# Patient Record
Sex: Male | Born: 1990 | Race: White | Hispanic: No | Marital: Single | Smoking: Current every day smoker
Health system: Southern US, Community
[De-identification: ages and names within clinical notes are randomized; demographics above are authoritative.]

## PROBLEM LIST (undated history)

## (undated) DIAGNOSIS — F1990 Other psychoactive substance use, unspecified, uncomplicated: Secondary | ICD-10-CM

## (undated) HISTORY — PX: EYE SURGERY: SHX253

---
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
## 2011-11-04 ENCOUNTER — Emergency Department: Payer: Self-pay | Admitting: Emergency Medicine

## 2011-11-07 LAB — BETA STREP CULTURE(ARMC)

## 2013-05-26 ENCOUNTER — Emergency Department: Payer: Self-pay | Admitting: Emergency Medicine

## 2013-07-18 ENCOUNTER — Emergency Department: Payer: Self-pay | Admitting: Emergency Medicine

## 2014-02-20 ENCOUNTER — Emergency Department: Payer: Self-pay | Admitting: Emergency Medicine

## 2015-02-01 ENCOUNTER — Encounter: Payer: Self-pay | Admitting: Emergency Medicine

## 2015-02-01 ENCOUNTER — Emergency Department
Admission: EM | Admit: 2015-02-01 | Discharge: 2015-02-01 | Disposition: A | Discharge status: Home / Self Care | Payer: Medicare (Managed Care) | Attending: Emergency Medicine | Admitting: Emergency Medicine

## 2015-02-01 ENCOUNTER — Emergency Department: Payer: Medicare (Managed Care)

## 2015-02-01 DIAGNOSIS — Z880 Allergy status to penicillin: Secondary | ICD-10-CM | POA: Diagnosis not present

## 2015-02-01 DIAGNOSIS — M25532 Pain in left wrist: Secondary | ICD-10-CM | POA: Diagnosis not present

## 2015-02-01 MED ORDER — NAPROXEN 500 MG PO TABS: 500.0000 mg | ORAL_TABLET | Freq: Two times a day (BID) | ORAL | Status: DC
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
## 2015-02-01 NOTE — Discharge Instructions (Signed)
Wear splint until evaluation by orthopedic Dr. °
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
## 2017-07-13 ENCOUNTER — Other Ambulatory Visit: Payer: Self-pay

## 2017-07-13 ENCOUNTER — Emergency Department
Admission: EM | Admit: 2017-07-13 | Discharge: 2017-07-13 | Disposition: A | Discharge status: Home / Self Care | Payer: PRIVATE HEALTH INSURANCE | Attending: Emergency Medicine | Admitting: Emergency Medicine

## 2017-07-13 ENCOUNTER — Encounter: Payer: Self-pay | Admitting: Emergency Medicine

## 2017-07-13 DIAGNOSIS — G5611 Other lesions of median nerve, right upper limb: Secondary | ICD-10-CM | POA: Insufficient documentation

## 2017-07-13 DIAGNOSIS — F17210 Nicotine dependence, cigarettes, uncomplicated: Secondary | ICD-10-CM | POA: Insufficient documentation

## 2017-07-13 NOTE — ED Notes (Signed)
FIRST NURSE NOTE:   Pt c/o decreased sensation in right hand for several days, states he felt a shooting pain from elbow down to hand last night. Pt states it is difficulty to grip things also.

## 2017-07-13 NOTE — ED Notes (Signed)
No protocols to be started at this time per Dr. Lenard Lance.

## 2017-07-13 NOTE — ED Triage Notes (Signed)
Pt comes into the ED via POV c/o hand numbness that has been ongoing for a couple of days.  Patient states that he woke up last night and the numbness was up to his elbow.  Patient denies any weakness or numbness anywhere else.  Patient states it is making it hard to grip things.  Patient having intermittent pain with it.  Patient in NAD at this time.
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
## 2017-07-13 NOTE — Discharge Instructions (Addendum)
Of increasing weakness, increasing numbness, or numbness or weakness or pain in any other areas of your body, return to the emergency room otherwise follow closely with orthopedic surgery.  Take Motrin for the discomfort.  Use the splint.  Try to decrease activity in that hand to the extent possible.

## 2018-12-07 ENCOUNTER — Emergency Department (HOSPITAL_BASED_OUTPATIENT_CLINIC_OR_DEPARTMENT_OTHER): Payer: BC Managed Care – PPO

## 2018-12-07 ENCOUNTER — Other Ambulatory Visit: Payer: Self-pay

## 2018-12-07 ENCOUNTER — Emergency Department (HOSPITAL_COMMUNITY)
Admission: EM | Admit: 2018-12-07 | Discharge: 2018-12-07 | Disposition: A | Discharge status: Home / Self Care | Payer: BC Managed Care – PPO | Attending: Emergency Medicine | Admitting: Emergency Medicine

## 2018-12-07 DIAGNOSIS — M79604 Pain in right leg: Secondary | ICD-10-CM | POA: Insufficient documentation

## 2018-12-07 DIAGNOSIS — F1210 Cannabis abuse, uncomplicated: Secondary | ICD-10-CM | POA: Diagnosis not present

## 2018-12-07 DIAGNOSIS — F17210 Nicotine dependence, cigarettes, uncomplicated: Secondary | ICD-10-CM | POA: Insufficient documentation

## 2018-12-07 DIAGNOSIS — M7989 Other specified soft tissue disorders: Secondary | ICD-10-CM | POA: Diagnosis not present

## 2018-12-07 DIAGNOSIS — M79609 Pain in unspecified limb: Secondary | ICD-10-CM | POA: Diagnosis not present

## 2018-12-07 MED ORDER — METHOCARBAMOL 500 MG PO TABS: 500.0000 mg | ORAL_TABLET | Freq: Two times a day (BID) | ORAL | 0 refills | Status: DC

## 2018-12-07 MED ORDER — ACETAMINOPHEN 500 MG PO TABS
1000.0000 mg | ORAL_TABLET | Freq: Once | ORAL | Status: AC
  Administered 2018-12-07: 1000 mg via ORAL
  Filled 2018-12-07: qty 2

## 2018-12-07 MED ORDER — NAPROXEN 500 MG PO TABS: 500.0000 mg | ORAL_TABLET | Freq: Two times a day (BID) | ORAL | 0 refills | Status: DC

## 2018-12-07 NOTE — ED Provider Notes (Signed)
[facility] EMERGENCY DEPARTMENT Provider Note   CSN: [id number] Arrival date & time: 12/07/18  1636   History   Chief Complaint Chief Complaint  Patient presents with  . Leg Pain   HPI Brendan Perez is a 28 y.o. male with no significant past medical history who presents for evaluation of right calf pain.  Patient states he has had right calf pain x2 days.  Patient states pain developed after helping his friend move.  He denies personal history of DVT, PE however does have a distant family member with a clotting disorder.  He denies any chest pain, shortness of breath, redness, warmth, swelling, decreased range of motion to extremities.  Rates his current pain a 4/10.  Denies recent travel, immobilization, surgery, malignancy.  No fever or chills.  Has not take anything for his pain.  He has smoked marijuana which has helped with the pain.  Denies additional aggravating or alleviating factors.  Denies any recent falls or trauma. No recent UR symptoms.  No recent falls or injuries.  History obtained from patient and past medical records.  No interpreter used.      HPI  No past medical history on file.  There are no active problems to display for this patient.   Past Surgical History:  Procedure Laterality Date  . EYE SURGERY       Home Medications    Prior to Admission medications   Medication Sig Start Date End Date Taking? Authorizing Provider  methocarbamol (ROBAXIN) 500 MG tablet Take 1 tablet (500 mg total) by mouth 2 (two) times daily. 12/07/18   Jaiven Graveline A, PA-C  naproxen (NAPROSYN) 500 MG tablet Take 1 tablet (500 mg total) by mouth 2 (two) times daily. 12/07/18   Khan Chura A, PA-C   Family History No family history on file.  Social History Social History   Tobacco Use  . Smoking status: Current Every Day Smoker    Packs/day: 1.00    Types: Cigarettes  . Smokeless tobacco: Never Used  Substance Use Topics  . Alcohol use: Not  Currently  . Drug use: Yes    Types: Marijuana    Comment: opiods     Allergies   Penicillins and Sulfa antibiotics   Review of Systems Review of Systems  Constitutional: Negative.   HENT: Negative.   Respiratory: Negative.   Cardiovascular: Negative.   Gastrointestinal: Negative.   Genitourinary: Negative.   Musculoskeletal: Negative for gait problem.       Right calf pain  Skin: Negative.   Neurological: Negative.   All other systems reviewed and are negative.  Physical Exam Updated Vital Signs BP 116/70 (BP Location: Right Arm)   Pulse 86   Temp 98.6 F (37 C) (Oral)   Resp 16   SpO2 98%   Physical Exam Vitals signs and nursing note reviewed.  Constitutional:      General: He is not in acute distress.    Appearance: He is well-developed. He is not ill-appearing, toxic-appearing or diaphoretic.  HENT:     Head: Normocephalic and atraumatic.     Nose: Nose normal.     Mouth/Throat:     Mouth: Mucous membranes are moist.     Pharynx: Oropharynx is clear.  Eyes:     Pupils: Pupils are equal, round, and reactive to light.  Neck:     Musculoskeletal: Normal range of motion and neck supple.  Cardiovascular:     Rate and Rhythm: Normal rate and regular  rhythm.     Pulses: Normal pulses.          Dorsalis pedis pulses are 2+ on the right side and 2+ on the left side.       Posterior tibial pulses are 2+ on the right side and 2+ on the left side.     Heart sounds: Normal heart sounds.  Pulmonary:     Effort: Pulmonary effort is normal. No respiratory distress.     Breath sounds: Normal breath sounds.  Abdominal:     General: Bowel sounds are normal. There is no distension.     Palpations: Abdomen is soft.  Musculoskeletal: Normal range of motion.        General: No swelling, deformity or signs of injury.     Right knee: Normal.     Left knee: Normal.     Right ankle: Normal.     Left ankle: Normal.     Right lower leg: He exhibits tenderness. He exhibits no  bony tenderness, no swelling and no deformity. No edema.     Left lower leg: Normal. No edema.       Legs:     Right foot: Normal.     Left foot: Normal.     Comments: Tenderness palpation to posterior medial calf.  No tenderness over Achilles tendon.  No edema, erythema, warmth.  Tenderness to popliteal fossa to right lower extremity.  Full range of motion without difficulty.  Compartments soft.  No bony tenderness.  Feet:     Right foot:     Skin integrity: Skin integrity normal.     Left foot:     Skin integrity: Skin integrity normal.  Skin:    General: Skin is warm and dry.     Capillary Refill: Capillary refill takes less than 2 seconds.     Coloration: Skin is not jaundiced or pale.     Findings: No bruising, erythema, lesion or rash.     Comments: No edema, erythema, ecchymosis or warmth.  Brisk capillary refill.  No fluctuance or induration.  No vesicles.  Neurological:     Mental Status: He is alert.     Sensory: Sensation is intact.     Motor: Motor function is intact.     Coordination: Coordination is intact.     Gait: Gait is intact.     Comments: Cranial nerves II through XII grossly intact.  Ambulatory with out difficulty.    ED Treatments / Results  Labs (all labs ordered are listed, but only abnormal results are displayed) Labs Reviewed - No data to display  EKG None  Radiology Vas [place] Lower Extremity Venous (dvt) (only [facility])  Result Date: 12/07/2018  Lower Venous Study Indications: Pain, Swelling, and Injury to leg 2 days ago.  Performing Technologist: June Leap RDMS, RVT  Examination Guidelines: A complete evaluation includes B-mode imaging, spectral Doppler, color Doppler, and power Doppler as needed of all accessible portions of each vessel. Bilateral testing is considered an integral part of a complete examination. Limited examinations for reoccurring indications may be performed as noted.   +---------+---------------+---------+-----------+----------+-------------------+ RIGHT    CompressibilityPhasicitySpontaneityProperties                    +---------+---------------+---------+-----------+----------+-------------------+ CFV  not imaged due to                                                         clothing                                                                  interference        +---------+---------------+---------+-----------+----------+-------------------+ FV Prox  Full           Yes      Yes                                      +---------+---------------+---------+-----------+----------+-------------------+ FV Mid   Full           Yes      Yes                                      +---------+---------------+---------+-----------+----------+-------------------+ FV DistalFull                                                             +---------+---------------+---------+-----------+----------+-------------------+ PFV      Full                                                             +---------+---------------+---------+-----------+----------+-------------------+ POP      Full           Yes      Yes                                      +---------+---------------+---------+-----------+----------+-------------------+ PTV      Full                                                             +---------+---------------+---------+-----------+----------+-------------------+ PERO     Full                                                             +---------+---------------+---------+-----------+----------+-------------------+     Summary: Right: There is no evidence of deep vein thrombosis in the lower extremity.  No cystic structure found in the popliteal fossa.  *See table(s) above for measurements and observations.    Preliminary     Procedures Procedures  (including critical care time)  Medications Ordered in ED Medications  acetaminophen (TYLENOL) tablet 1,000 mg (1,000 mg Oral Given 12/07/18 1712)   Initial Impression / Assessment and Plan / ED Course  I have reviewed the triage vital signs and the nursing notes.  Pertinent labs & imaging results that were available during my care of the patient were reviewed by me and considered in my medical decision making (see chart for details).  28 year old male appears otherwise well presents for evaluation of right calf pain after helping his friend move.  Afebrile, nonseptic, not ill-appearing.  No recent trauma, immobilization, malignancy, recent surgery.  He does have a distant family member who has a clotting disorder however he does not know of anyone closer personally who has history of PE or DVT.  He denies chest pain, shortness of breath.  He is afebrile without tachycardia, tachypnea or hypoxia.  Ultrasound negative for DVT.  He has no bony tenderness.  No evidence of infectious process on exam.  Compartments soft.  Low suspicion for myositis, compartment syndrome, acute fracture, dislocation, cellulitis, abscess. Negative Thompson test, no tenderness over his Achilles tendon.  Possible MSK sprain or strain.  He is ambulatory without difficulty.  Will DC home with anti-inflammatories, rest.  Patient to follow-up if he develops new or worsening symptoms.  The patient has been appropriately medically screened and/or stabilized in the ED. I have low suspicion for any other emergent medical condition which would require further screening, evaluation or treatment in the ED or require inpatient management.       Final Clinical Impressions(s) / ED Diagnoses   Final diagnoses:  Right leg pain    ED Discharge Orders         Ordered    methocarbamol (ROBAXIN) 500 MG tablet  2 times daily     12/07/18 1749    naproxen (NAPROSYN) 500 MG tablet  2 times daily     12/07/18 1749            Kenn Rekowski A, PA-C 12/07/18 1800    Raeford Razor, MD 12/08/18 1523

## 2018-12-07 NOTE — Discharge Instructions (Signed)
Take the muscle relaxer and ibuprofen Tylenol as prescribed.  Try to heat this area, elevate it.  If you develop swelling, redness, warmth to leg please seek reevaluation emergency department.

## 2018-12-07 NOTE — ED Triage Notes (Signed)
Pt presents with RLE pain in calf onset while helping his friend move on Saturday. Pt smoking weed for pain with some relief. Pt ambulatory.

## 2018-12-31 ENCOUNTER — Emergency Department (HOSPITAL_COMMUNITY)
Admission: EM | Admit: 2018-12-31 | Discharge: 2019-01-01 | Disposition: A | Discharge status: Home / Self Care | Payer: BC Managed Care – PPO | Attending: Emergency Medicine | Admitting: Emergency Medicine

## 2018-12-31 ENCOUNTER — Other Ambulatory Visit: Payer: Self-pay

## 2018-12-31 ENCOUNTER — Encounter (HOSPITAL_COMMUNITY): Payer: Self-pay | Admitting: Emergency Medicine

## 2018-12-31 DIAGNOSIS — M79606 Pain in leg, unspecified: Secondary | ICD-10-CM | POA: Insufficient documentation

## 2018-12-31 DIAGNOSIS — Z5321 Procedure and treatment not carried out due to patient leaving prior to being seen by health care provider: Secondary | ICD-10-CM | POA: Diagnosis not present

## 2018-12-31 NOTE — ED Triage Notes (Signed)
Pt reports continued left leg pain that starts in his knee w/ sharp shooting pain down to his foot.  Pt is able to walk on it but it is painful.  Pt also complain of pressure behind both ears (for years).

## 2019-01-01 NOTE — ED Notes (Signed)
Pt called x3 in the waiting room, no response. 

## 2019-12-22 ENCOUNTER — Other Ambulatory Visit: Payer: Self-pay

## 2019-12-22 ENCOUNTER — Encounter (HOSPITAL_COMMUNITY): Payer: Self-pay | Admitting: Emergency Medicine

## 2019-12-22 ENCOUNTER — Emergency Department (HOSPITAL_COMMUNITY)
Admission: EM | Admit: 2019-12-22 | Discharge: 2019-12-23 | Disposition: A | Discharge status: Home / Self Care | Payer: Self-pay | Attending: Emergency Medicine | Admitting: Emergency Medicine

## 2019-12-22 DIAGNOSIS — R109 Unspecified abdominal pain: Secondary | ICD-10-CM | POA: Insufficient documentation

## 2019-12-22 DIAGNOSIS — R0781 Pleurodynia: Secondary | ICD-10-CM | POA: Insufficient documentation

## 2019-12-22 DIAGNOSIS — F17210 Nicotine dependence, cigarettes, uncomplicated: Secondary | ICD-10-CM | POA: Insufficient documentation

## 2019-12-22 MED ORDER — KETOROLAC TROMETHAMINE 15 MG/ML IJ SOLN
15.0000 mg | Freq: Once | INTRAMUSCULAR | Status: AC
  Administered 2019-12-23: 15 mg via INTRAMUSCULAR
  Filled 2019-12-22: qty 1

## 2019-12-22 MED ORDER — DIAZEPAM 5 MG PO TABS
5.0000 mg | ORAL_TABLET | Freq: Once | ORAL | Status: AC
  Administered 2019-12-23: 5 mg via ORAL
  Filled 2019-12-22: qty 1

## 2019-12-22 MED ORDER — ACETAMINOPHEN 500 MG PO TABS
1000.0000 mg | ORAL_TABLET | Freq: Once | ORAL | Status: AC
  Administered 2019-12-23: 1000 mg via ORAL
  Filled 2019-12-22: qty 2

## 2019-12-22 MED ORDER — OXYCODONE HCL 5 MG PO TABS
5.0000 mg | ORAL_TABLET | Freq: Once | ORAL | Status: AC
  Administered 2019-12-23: 5 mg via ORAL
  Filled 2019-12-22: qty 1

## 2019-12-22 NOTE — ED Provider Notes (Signed)
[facility]-EMERGENCY DEPT Provider Note   CSN: [id number] Arrival date & time: 12/22/19  2219     History Chief Complaint  Patient presents with  . Abdominal Pain    Brendan Perez is a 29 y.o. male.  28 yo M with a chief complaints of right-sided chest wall pain.  Is been going on for a few days now.  He woke up and started having pain there.  Worse with twisting turning deep breathing.  Denies trauma.  Denies abdominal pain denies fevers denies nausea or vomiting.  The history is provided by the patient.  Abdominal Pain Associated symptoms: chest pain   Associated symptoms: no chills, no diarrhea, no fever, no shortness of breath and no vomiting   Chest Pain Pain location:  R lateral chest Pain quality: sharp and shooting   Pain radiates to:  Does not radiate Pain severity:  Moderate Onset quality:  Gradual Duration:  4 days Timing:  Constant Progression:  Worsening Chronicity:  New Relieved by:  Nothing Worsened by:  Nothing Ineffective treatments:  None tried Associated symptoms: abdominal pain   Associated symptoms: no fever, no headache, no palpitations, no shortness of breath and no vomiting        History reviewed. No pertinent past medical history.  There are no problems to display for this patient.   Past Surgical History:  Procedure Laterality Date  . EYE SURGERY         History reviewed. No pertinent family history.  Social History   Tobacco Use  . Smoking status: Current Every Day Smoker    Packs/day: 1.00    Types: Cigarettes  . Smokeless tobacco: Never Used  Substance Use Topics  . Alcohol use: Not Currently  . Drug use: Yes    Types: Marijuana    Comment: opiods    Home Medications Prior to Admission medications   Medication Sig Start Date End Date Taking? Authorizing Provider  methocarbamol (ROBAXIN) 500 MG tablet Take 1 tablet (500 mg total) by mouth 2 (two) times daily. 12/07/18   Henderly, Britni A, PA-C   naproxen (NAPROSYN) 500 MG tablet Take 1 tablet (500 mg total) by mouth 2 (two) times daily. 12/07/18   Henderly, Britni A, PA-C    Allergies    Penicillins and Sulfa antibiotics  Review of Systems   Review of Systems  Constitutional: Negative for chills and fever.  HENT: Negative for congestion and facial swelling.   Eyes: Negative for discharge and visual disturbance.  Respiratory: Negative for shortness of breath.   Cardiovascular: Positive for chest pain. Negative for palpitations.  Gastrointestinal: Positive for abdominal pain. Negative for diarrhea and vomiting.  Musculoskeletal: Negative for arthralgias and myalgias.  Skin: Negative for color change and rash.  Neurological: Negative for tremors, syncope and headaches.  Psychiatric/Behavioral: Negative for confusion and dysphoric mood.    Physical Exam Updated Vital Signs BP 129/90 (BP Location: Right Arm)   Pulse 98   Temp 98.3 F (36.8 C) (Oral)   Resp 18   Ht 5\' 11"  (1.803 m)   Wt 74.8 kg   SpO2 100%   BMI 23.01 kg/m   Physical Exam Vitals and nursing note reviewed.  Constitutional:      Appearance: He is well-developed.  HENT:     Head: Normocephalic and atraumatic.  Eyes:     Pupils: Pupils are equal, round, and reactive to light.  Neck:     Vascular: No JVD.  Cardiovascular:     Rate  and Rhythm: Normal rate and regular rhythm.     Heart sounds: No murmur heard.  No friction rub. No gallop.   Pulmonary:     Effort: No respiratory distress.     Breath sounds: No wheezing.  Chest:       Comments: Area of pain, no noted rash, no crepitus Abdominal:     General: There is no distension.     Tenderness: There is no abdominal tenderness. There is no guarding or rebound.     Comments: Benign abdominal exam  Musculoskeletal:        General: Normal range of motion.     Cervical back: Normal range of motion and neck supple.  Skin:    Coloration: Skin is not pale.     Findings: No rash.  Neurological:       Mental Status: He is alert and oriented to person, place, and time.  Psychiatric:        Behavior: Behavior normal.     ED Results / Procedures / Treatments   Labs (all labs ordered are listed, but only abnormal results are displayed) Labs Reviewed - No data to display  EKG None  Radiology DG Ribs Unilateral W/Chest Right  Result Date: 12/23/2019 CLINICAL DATA:  Right rib pain.  No injury. EXAM: RIGHT RIBS AND CHEST - 3+ VIEW COMPARISON:  None. FINDINGS: No fracture or other bone lesions are seen involving the ribs. There is no evidence of pneumothorax or pleural effusion. Both lungs are clear. Heart size and mediastinal contours are within normal limits. IMPRESSION: Negative. Electronically Signed   By: Charlett Nose M.D.   On: 12/23/2019 00:43    Procedures Procedures (including critical care time)  Medications Ordered in ED Medications  acetaminophen (TYLENOL) tablet 1,000 mg (1,000 mg Oral Given 12/23/19 0043)  ketorolac (TORADOL) 15 MG/ML injection 15 mg (15 mg Intramuscular Given 12/23/19 0044)  oxyCODONE (Oxy IR/ROXICODONE) immediate release tablet 5 mg (5 mg Oral Given 12/23/19 0043)  diazepam (VALIUM) tablet 5 mg (5 mg Oral Given 12/23/19 0043)    ED Course  I have reviewed the triage vital signs and the nursing notes.  Pertinent labs & imaging results that were available during my care of the patient were reviewed by me and considered in my medical decision making (see chart for details).    MDM Rules/Calculators/A&P                          29 yo M with a cc of R chest pain.  Going on for a few days.  Reproduces on exam, likely muscular.  Will obtain a CXR.     CXR viewed by me without focal infiltrate or ptx.  D/c home.   1:43 AM:  I have discussed the diagnosis/risks/treatment options with the patient and believe the pt to be eligible for discharge home to follow-up with PCP. We also discussed returning to the ED immediately if new or worsening sx occur. We  discussed the sx which are most concerning (e.g., sudden worsening pain, fever, inability to tolerate by mouth) that necessitate immediate return. Medications administered to the patient during their visit and any new prescriptions provided to the patient are listed below.  Medications given during this visit Medications  acetaminophen (TYLENOL) tablet 1,000 mg (1,000 mg Oral Given 12/23/19 0043)  ketorolac (TORADOL) 15 MG/ML injection 15 mg (15 mg Intramuscular Given 12/23/19 0044)  oxyCODONE (Oxy IR/ROXICODONE) immediate release tablet 5 mg (5 mg  Oral Given 12/23/19 0043)  diazepam (VALIUM) tablet 5 mg (5 mg Oral Given 12/23/19 0043)     The patient appears reasonably screen and/or stabilized for discharge and I doubt any other medical condition or other [facility] requiring further screening, evaluation, or treatment in the ED at this time prior to discharge.    Final Clinical Impression(s) / ED Diagnoses Final diagnoses:  Rib pain on right side    Rx / DC Orders ED Discharge Orders    None       Melene Plan, DO 12/23/19 0143

## 2019-12-22 NOTE — ED Triage Notes (Signed)
Patient is complaining of right upper abdominal pain that started yesterday.

## 2019-12-23 ENCOUNTER — Emergency Department (HOSPITAL_COMMUNITY): Payer: Self-pay

## 2019-12-23 NOTE — Discharge Instructions (Signed)
Take 4 over the counter ibuprofen tablets 3 times a day or 2 over-the-counter naproxen tablets twice a day for pain. Also take tylenol 1000mg (2 extra strength) four times a day.    He can hold a pillow against your side may take a deep breath or cough which can help your discomfort.

## 2020-09-25 ENCOUNTER — Other Ambulatory Visit: Payer: Self-pay

## 2020-09-25 DIAGNOSIS — L03115 Cellulitis of right lower limb: Secondary | ICD-10-CM | POA: Diagnosis present

## 2020-09-25 DIAGNOSIS — Z882 Allergy status to sulfonamides status: Secondary | ICD-10-CM

## 2020-09-25 DIAGNOSIS — L03116 Cellulitis of left lower limb: Principal | ICD-10-CM | POA: Diagnosis present

## 2020-09-25 DIAGNOSIS — F1910 Other psychoactive substance abuse, uncomplicated: Secondary | ICD-10-CM | POA: Diagnosis present

## 2020-09-25 DIAGNOSIS — F17210 Nicotine dependence, cigarettes, uncomplicated: Secondary | ICD-10-CM | POA: Diagnosis present

## 2020-09-25 DIAGNOSIS — Z20822 Contact with and (suspected) exposure to covid-19: Secondary | ICD-10-CM | POA: Diagnosis present

## 2020-09-25 DIAGNOSIS — Z880 Allergy status to penicillin: Secondary | ICD-10-CM

## 2020-09-25 LAB — CBC WITH DIFFERENTIAL/PLATELET
Abs Immature Granulocytes: 0.01 10*3/uL (ref 0.00–0.07)
Basophils Absolute: 0 10*3/uL (ref 0.0–0.1)
Basophils Relative: 0 %
Eosinophils Absolute: 0 10*3/uL (ref 0.0–0.5)
Eosinophils Relative: 1 %
HCT: 32.7 % — ABNORMAL LOW (ref 39.0–52.0)
Hemoglobin: 11.2 g/dL — ABNORMAL LOW (ref 13.0–17.0)
Immature Granulocytes: 0 %
Lymphocytes Relative: 16 %
Lymphs Abs: 0.9 10*3/uL (ref 0.7–4.0)
MCH: 29.7 pg (ref 26.0–34.0)
MCHC: 34.3 g/dL (ref 30.0–36.0)
MCV: 86.7 fL (ref 80.0–100.0)
Monocytes Absolute: 0.4 10*3/uL (ref 0.1–1.0)
Monocytes Relative: 7 %
Neutro Abs: 4.4 10*3/uL (ref 1.7–7.7)
Neutrophils Relative %: 76 %
Platelets: 151 10*3/uL (ref 150–400)
RBC: 3.77 MIL/uL — ABNORMAL LOW (ref 4.22–5.81)
RDW: 13.4 % (ref 11.5–15.5)
WBC: 5.8 10*3/uL (ref 4.0–10.5)
nRBC: 0 % (ref 0.0–0.2)

## 2020-09-25 LAB — BASIC METABOLIC PANEL
Anion gap: 5 (ref 5–15)
BUN: 10 mg/dL (ref 6–20)
CO2: 28 mmol/L (ref 22–32)
Calcium: 8.2 mg/dL — ABNORMAL LOW (ref 8.9–10.3)
Chloride: 98 mmol/L (ref 98–111)
Creatinine, Ser: 0.71 mg/dL (ref 0.61–1.24)
GFR, Estimated: >60 mL/min (ref >60)
Glucose, Bld: 127 mg/dL — ABNORMAL HIGH (ref 70–99)
Potassium: 4.1 mmol/L (ref 3.5–5.1)
Sodium: 131 mmol/L — ABNORMAL LOW (ref 135–145)

## 2020-09-25 MED ORDER — ACETAMINOPHEN 500 MG PO TABS
1000.0000 mg | ORAL_TABLET | Freq: Once | ORAL | Status: AC
  Administered 2020-09-25: 1000 mg via ORAL
  Filled 2020-09-25: qty 2

## 2020-09-25 NOTE — ED Triage Notes (Signed)
Pt c/o bilateral swelling to lower extremities. Pt states he hasn't worked for the past 2 months, but started again this past week. Pt noticed swelling to his ankles last Friday. Pt has significant swelling to lower extremities with redness and heat radiating off. Pt states it has been difficult to walk. Pt denies shortness of breath or chest pain.

## 2020-09-26 ENCOUNTER — Emergency Department: Payer: Self-pay

## 2020-09-26 ENCOUNTER — Encounter: Payer: Self-pay | Admitting: Emergency Medicine

## 2020-09-26 ENCOUNTER — Inpatient Hospital Stay
Admission: EM | Admit: 2020-09-26 | Discharge: 2020-09-27 | DRG: 603 | Disposition: A | Discharge status: Home / Self Care | Payer: Self-pay | Attending: Internal Medicine | Admitting: Internal Medicine

## 2020-09-26 DIAGNOSIS — F17200 Nicotine dependence, unspecified, uncomplicated: Secondary | ICD-10-CM | POA: Diagnosis present

## 2020-09-26 DIAGNOSIS — L03116 Cellulitis of left lower limb: Secondary | ICD-10-CM

## 2020-09-26 DIAGNOSIS — F1990 Other psychoactive substance use, unspecified, uncomplicated: Secondary | ICD-10-CM

## 2020-09-26 DIAGNOSIS — F17213 Nicotine dependence, cigarettes, with withdrawal: Secondary | ICD-10-CM

## 2020-09-26 DIAGNOSIS — L03119 Cellulitis of unspecified part of limb: Secondary | ICD-10-CM

## 2020-09-26 DIAGNOSIS — R509 Fever, unspecified: Secondary | ICD-10-CM

## 2020-09-26 DIAGNOSIS — F1910 Other psychoactive substance abuse, uncomplicated: Secondary | ICD-10-CM

## 2020-09-26 DIAGNOSIS — L0390 Cellulitis, unspecified: Secondary | ICD-10-CM | POA: Diagnosis present

## 2020-09-26 DIAGNOSIS — L03115 Cellulitis of right lower limb: Secondary | ICD-10-CM

## 2020-09-26 HISTORY — DX: Other psychoactive substance use, unspecified, uncomplicated: F19.90

## 2020-09-26 LAB — PROTIME-INR
INR: 1 (ref 0.8–1.2)
Prothrombin Time: 13.6 seconds (ref 11.4–15.2)

## 2020-09-26 LAB — RESP PANEL BY RT-PCR (FLU A&B, COVID) ARPGX2
Influenza A by PCR: NEGATIVE
Influenza B by PCR: NEGATIVE
SARS Coronavirus 2 by RT PCR: NEGATIVE

## 2020-09-26 LAB — LACTIC ACID, PLASMA
Lactic Acid, Venous: 1.1 mmol/L (ref 0.5–1.9)
Lactic Acid, Venous: 1.3 mmol/L (ref 0.5–1.9)

## 2020-09-26 MED ORDER — THIAMINE HCL 100 MG/ML IJ SOLN: 100.0000 mg | Freq: Every day | INTRAMUSCULAR | Status: DC

## 2020-09-26 MED ORDER — FOLIC ACID 1 MG PO TABS
1.0000 mg | ORAL_TABLET | Freq: Every day | ORAL | Status: DC
  Administered 2020-09-26 – 2020-09-27 (×2): 1 mg via ORAL
  Filled 2020-09-26 (×2): qty 1

## 2020-09-26 MED ORDER — LORAZEPAM 2 MG/ML IJ SOLN: 1.0000 mg | INTRAMUSCULAR | Status: DC | PRN

## 2020-09-26 MED ORDER — LORAZEPAM 1 MG PO TABS: 1.0000 mg | ORAL_TABLET | ORAL | Status: DC | PRN

## 2020-09-26 MED ORDER — ADULT MULTIVITAMIN W/MINERALS CH
1.0000 | ORAL_TABLET | Freq: Every day | ORAL | Status: DC
  Administered 2020-09-26 – 2020-09-27 (×2): 1 via ORAL
  Filled 2020-09-26 (×2): qty 1

## 2020-09-26 MED ORDER — THIAMINE HCL 100 MG PO TABS
100.0000 mg | ORAL_TABLET | Freq: Every day | ORAL | Status: DC
  Administered 2020-09-26 – 2020-09-27 (×2): 100 mg via ORAL
  Filled 2020-09-26 (×2): qty 1

## 2020-09-26 MED ORDER — SODIUM CHLORIDE 0.9% FLUSH: 3.0000 mL | INTRAVENOUS | Status: DC | PRN

## 2020-09-26 MED ORDER — ENOXAPARIN SODIUM 40 MG/0.4ML IJ SOSY
40.0000 mg | PREFILLED_SYRINGE | INTRAMUSCULAR | Status: DC
  Administered 2020-09-26 – 2020-09-27 (×2): 40 mg via SUBCUTANEOUS
  Filled 2020-09-26 (×2): qty 0.4

## 2020-09-26 MED ORDER — VANCOMYCIN HCL 1750 MG/350ML IV SOLN
1750.0000 mg | Freq: Once | INTRAVENOUS | Status: AC
  Administered 2020-09-26: 1750 mg via INTRAVENOUS
  Filled 2020-09-26: qty 350

## 2020-09-26 MED ORDER — LORAZEPAM 2 MG PO TABS: 0.0000 mg | ORAL_TABLET | Freq: Two times a day (BID) | ORAL | Status: DC

## 2020-09-26 MED ORDER — ONDANSETRON HCL 4 MG PO TABS: 4.0000 mg | ORAL_TABLET | Freq: Four times a day (QID) | ORAL | Status: DC | PRN

## 2020-09-26 MED ORDER — SODIUM CHLORIDE 0.9 % IV SOLN: 250.0000 mL | INTRAVENOUS | Status: DC | PRN

## 2020-09-26 MED ORDER — SODIUM CHLORIDE 0.9 % IV SOLN
1.0000 g | Freq: Once | INTRAVENOUS | Status: AC
  Administered 2020-09-26: 1 g via INTRAVENOUS
  Filled 2020-09-26: qty 10

## 2020-09-26 MED ORDER — SODIUM CHLORIDE 0.9% FLUSH
3.0000 mL | Freq: Two times a day (BID) | INTRAVENOUS | Status: DC
  Administered 2020-09-26 – 2020-09-27 (×2): 3 mL via INTRAVENOUS

## 2020-09-26 MED ORDER — LORAZEPAM 2 MG PO TABS
0.0000 mg | ORAL_TABLET | Freq: Four times a day (QID) | ORAL | Status: DC
Start: 2020-09-26 — End: 2020-09-27

## 2020-09-26 MED ORDER — ACETAMINOPHEN 650 MG RE SUPP: 650.0000 mg | Freq: Four times a day (QID) | RECTAL | Status: DC | PRN

## 2020-09-26 MED ORDER — CEFAZOLIN SODIUM-DEXTROSE 1-4 GM/50ML-% IV SOLN
1.0000 g | Freq: Three times a day (TID) | INTRAVENOUS | Status: DC
  Administered 2020-09-26 – 2020-09-27 (×4): 1 g via INTRAVENOUS
  Filled 2020-09-26 (×6): qty 50

## 2020-09-26 MED ORDER — OXYCODONE HCL 5 MG PO TABS
5.0000 mg | ORAL_TABLET | ORAL | Status: DC | PRN
Start: 2020-09-26 — End: 2020-09-27
  Administered 2020-09-26 – 2020-09-27 (×3): 5 mg via ORAL
  Filled 2020-09-26 (×3): qty 1

## 2020-09-26 MED ORDER — ONDANSETRON HCL 4 MG/2ML IJ SOLN: 4.0000 mg | Freq: Four times a day (QID) | INTRAMUSCULAR | Status: DC | PRN

## 2020-09-26 MED ORDER — ACETAMINOPHEN 325 MG PO TABS: 650.0000 mg | ORAL_TABLET | Freq: Four times a day (QID) | ORAL | Status: DC | PRN

## 2020-09-26 NOTE — Progress Notes (Signed)
PHARMACY -  BRIEF ANTIBIOTIC NOTE   Pharmacy has received consult(s) for Vancomycin from an ED provider.  The patient's profile has been reviewed for ht/wt/allergies/indication/available labs.    One time order(s) placed for Vancomycin 1750 mg per pt wt: 72.6 kg  Further antibiotics/pharmacy consults should be ordered by admitting physician if indicated.                       Otelia Sergeant, PharmD, [facility] 09/26/2020 2:54 AM

## 2020-09-26 NOTE — ED Provider Notes (Signed)
[facility] Emergency Department Provider Note  ____________________________________________   Event Date/Time   First MD Initiated Contact with Patient 09/26/20 0159     (approximate)  I have reviewed the triage vital signs and the nursing notes.   HISTORY  Chief Complaint Leg Swelling    HPI Brendan Perez is a 30 y.o. male with no chronic medical conditions who presents for evaluation of 3 days of gradually worsening pain, swelling, and redness in his lower legs, primarily the left side.  He first noticed a couple of days ago and thought maybe he got just a rash or a bite but it got significantly worse over the last 24 hours.  Hurts to bear any weight.  His foot and his lower leg is swollen up the shin on the left and his right foot is red and swollen with some extension above the ankle.  He has no history of blood clots in the legs of the lungs.  His legs feel hot and swollen.  Elevating them make them feel little bit better and bearing weight makes it feel worse.  He was unaware he had a fever when he first arrived but his triage temperature was greater than 103.  He has had no chest pain, shortness of breath, nausea, vomiting, nor abdominal pain.  After I asked explicitly, he admits that he uses IV drugs, primarily fentanyl and "dope".  He should be track marks on his right arm.  He has no rash or swelling on his right arms nor any loss on his body other than his lower legs.  The symptoms have become severe rather acutely over the last 24 hours.     Past Medical History:  Diagnosis Date   IVDU (intravenous drug user)     There are no problems to display for this patient.   Past Surgical History:  Procedure Laterality Date   EYE SURGERY      Prior to Admission medications   Medication Sig Start Date End Date Taking? Authorizing Provider  methocarbamol (ROBAXIN) 500 MG tablet Take 1 tablet (500 mg total) by mouth 2 (two) times daily. Patient  not taking: Reported on 09/26/2020 12/07/18   Henderly, Britni A, PA-C  naproxen (NAPROSYN) 500 MG tablet Take 1 tablet (500 mg total) by mouth 2 (two) times daily. Patient not taking: Reported on 09/26/2020 12/07/18   Henderly, Britni A, PA-C    Allergies Penicillins and Sulfa antibiotics  History reviewed. No pertinent family history.  Social History Social History   Tobacco Use   Smoking status: Every Day    Packs/day: 1.00    Types: Cigarettes   Smokeless tobacco: Never  Substance Use Topics   Alcohol use: Not Currently   Drug use: Yes    Types: IV, Marijuana    Comment: opioids    Review of Systems Constitutional: Fever to greater than 103 in triage. Eyes: No visual changes. ENT: No sore throat. Cardiovascular: Denies chest pain. Respiratory: Denies shortness of breath. Gastrointestinal: No abdominal pain.  No nausea, no vomiting.  No diarrhea.  No constipation. Genitourinary: Negative for dysuria. Musculoskeletal: Swelling, pain, redness, and rash on bilateral lower extremities but worse on the left, rapidly worsening over 3 days. Integumentary: Red and splotchy rash on bilateral lower extremities. Neurological: Negative for headaches, focal weakness or numbness.   ____________________________________________   PHYSICAL EXAM:  VITAL SIGNS: ED Triage Vitals  Enc Vitals Group     BP 09/25/20 1803 118/68     Pulse  Rate 09/25/20 1803 94     Resp 09/25/20 1803 18     Temp 09/25/20 1803 (!) 103.1 F (39.5 C)     Temp Source 09/25/20 1900 Oral     SpO2 09/25/20 1803 96 %     Weight 09/25/20 1904 72.6 kg (160 lb)     Height 09/25/20 1904 1.803 m (5\' 11" )     Head Circumference --      Peak Flow --      Pain Score 09/25/20 1903 8     Pain Loc --      Pain Edu? --      Excl. in GC? --     Constitutional: Alert and oriented.  Eyes: Conjunctivae are normal.  Head: Atraumatic. Nose: No congestion/rhinnorhea. Mouth/Throat: Patient is wearing a mask. Neck: No  stridor.  No meningeal signs.   Cardiovascular: Normal rate, regular rhythm. Good peripheral circulation. Respiratory: Normal respiratory effort.  No retractions. Gastrointestinal: Soft and nontender. No distention.  Neurologic:  Normal speech and language. No gross focal neurologic deficits are appreciated.  Psychiatric: Mood and affect are normal. Speech and behavior are normal. Musculoskeletal: Pitting edema in bilateral lower extremities, worse on the left than the right.  Consistent with cellulitis. Legs are tender to the touch but without crepitus and the pain is not out of proportion to exam.  Not consistent with necrotizing fasciitis. Skin: Patient has multiple small wounds most notable on his upper extremities with some IV track marks on his right antecubital fossa.  His left and right lower extremities are erythematous with some petechial rash in addition to generalized erythema and edema.  No purpura.       ____________________________________________   LABS (all labs ordered are listed, but only abnormal results are displayed)  Labs Reviewed  CBC WITH DIFFERENTIAL/PLATELET - Abnormal; Notable for the following components:      Result Value   RBC 3.77 (*)    Hemoglobin 11.2 (*)    HCT 32.7 (*)    All other components within normal limits  BASIC METABOLIC PANEL - Abnormal; Notable for the following components:   Sodium 131 (*)    Glucose, Bld 127 (*)    Calcium 8.2 (*)    All other components within normal limits  RESP PANEL BY RT-PCR (FLU A&B, COVID) ARPGX2  CULTURE, BLOOD (ROUTINE X 2)  CULTURE, BLOOD (ROUTINE X 2)  LACTIC ACID, PLASMA  PROTIME-INR  LACTIC ACID, PLASMA   ____________________________________________   RADIOLOGY I, 11/25/20, personally viewed and evaluated these images (plain radiographs) as part of my medical decision making, as well as reviewing the written report by the radiologist.  ED MD interpretation: No acute abnormality identified on  tibia/fibula radiograph nor on chest x-ray.  Bilateral lower extremity ultrasound does not show any evidence of DVT in either lower extremity.  Official radiology report(s): DG Tibia/Fibula Left  Result Date: 09/26/2020 CLINICAL DATA:  Left lower extremity swelling EXAM: LEFT TIBIA AND FIBULA - 2 VIEW COMPARISON:  None. FINDINGS: There is no evidence of fracture or other focal bone lesions. Soft tissues are unremarkable. IMPRESSION: No acute abnormality noted. Electronically Signed   By: 11/26/2020 M.D.   On: 09/26/2020 03:17   11/26/2020 Venous Img Lower Bilateral  Result Date: 09/26/2020 CLINICAL DATA:  Initial evaluation for bilateral lower extremity redness, swelling. EXAM: BILATERAL LOWER EXTREMITY VENOUS DOPPLER ULTRASOUND TECHNIQUE: Gray-scale sonography with graded compression, as well as color Doppler and duplex ultrasound were performed to evaluate the lower extremity  deep venous systems from the level of the common femoral vein and including the common femoral, femoral, profunda femoral, popliteal and calf veins including the posterior tibial, peroneal and gastrocnemius veins when visible. The superficial great saphenous vein was also interrogated. Spectral Doppler was utilized to evaluate flow at rest and with distal augmentation maneuvers in the common femoral, femoral and popliteal veins. COMPARISON:  Radiograph from earlier the same day. FINDINGS: RIGHT LOWER EXTREMITY Common Femoral Vein: No evidence of thrombus. Normal compressibility, respiratory phasicity and response to augmentation. Saphenofemoral Junction: No evidence of thrombus. Normal compressibility and flow on color Doppler imaging. Profunda Femoral Vein: No evidence of thrombus. Normal compressibility and flow on color Doppler imaging. Femoral Vein: No evidence of thrombus. Normal compressibility, respiratory phasicity and response to augmentation. Popliteal Vein: No evidence of thrombus. Normal compressibility, respiratory phasicity and  response to augmentation. Calf Veins: No evidence of thrombus. Normal compressibility and flow on color Doppler imaging. Superficial Great Saphenous Vein: No evidence of thrombus. Normal compressibility. Venous Reflux:  None. Other Findings:  None. LEFT LOWER EXTREMITY Common Femoral Vein: No evidence of thrombus. Normal compressibility, respiratory phasicity and response to augmentation. Saphenofemoral Junction: No evidence of thrombus. Normal compressibility and flow on color Doppler imaging. Profunda Femoral Vein: No evidence of thrombus. Normal compressibility and flow on color Doppler imaging. Femoral Vein: No evidence of thrombus. Normal compressibility, respiratory phasicity and response to augmentation. Popliteal Vein: No evidence of thrombus. Normal compressibility, respiratory phasicity and response to augmentation. Calf Veins: No evidence of thrombus. Normal compressibility and flow on color Doppler imaging. Superficial Great Saphenous Vein: No evidence of thrombus. Normal compressibility. Venous Reflux:  None. Other Findings:  None. IMPRESSION: No evidence of deep venous thrombosis in either lower extremity. Electronically Signed   By: Rise Mu M.D.   On: 09/26/2020 04:19   DG Chest Port 1 View  Result Date: 09/26/2020 CLINICAL DATA:  Possible sepsis EXAM: PORTABLE CHEST 1 VIEW COMPARISON:  12/23/2019 FINDINGS: The heart size and mediastinal contours are within normal limits. Both lungs are clear. The visualized skeletal structures are unremarkable. IMPRESSION: No active disease. Electronically Signed   By: Alcide Clever M.D.   On: 09/26/2020 03:16    ____________________________________________   PROCEDURES   Procedure(s) performed (including Critical Care):  Procedures   ____________________________________________   INITIAL IMPRESSION / MDM / ASSESSMENT AND PLAN / ED COURSE  As part of my medical decision making, I reviewed the following data within the electronic  MEDICAL RECORD NUMBER Nursing notes reviewed and incorporated, Labs reviewed , Old chart reviewed, Radiograph reviewed , Discussed with admitting physician , and Notes from prior ED visits   Differential diagnosis includes, but is not limited to, cellulitis, bacteremia, DVT, endocarditis.  Patient does not meet sepsis criteria.  He has a fever but no tachycardia and no leukocytosis.  His metabolic panel is also normal.  However, I am very concerned about the possibility of bacteremia which could develop into endocarditis.  He has no particular risk factors for developing bilateral lower extremity cellulitis and the left leg is considerably worse than the right.  I strongly doubt necrotizing fasciitis and I will obtain an x-ray of the most affected limb which is the left lower extremity but I doubt there will be any gas forming organisms.  I will obtain an ultrasound of bilateral lower extremities to rule out DVT but this appears consistent with cellulitis likely secondary to IV drug use bacteremia.  Given the rapid worsening over the last 24 hours,  the fever of greater than 103, and the IV drug use with cellulitis in his lower legs, I will begin ceftriaxone 1 g IV and vancomycin per pharmacy consult.  I will admit the patient for inpatient care, and IV antibiotics, at least until the blood cultures come back negative.       Clinical Course as of 09/26/20 0447  Tue Sep 26, 2020  0346 I personally reviewed the patient's imaging and agree with the radiologist's interpretation that there is no evidence of gas-forming organisms on the tib-fib x-ray and he has a normal chest x-ray. [CF]  0447 US Venous Img Lower Bilateral No evidence of DVT.  I discussed the case with Dr. Para Marchuncan with a hospitalist service and she will admit [CF]    Clinical Course User Index [CF] Loleta RoseForbach, Janelie Goltz, MD     ____________________________________________  FINAL CLINICAL IMPRESSION(S) / ED DIAGNOSES  Final diagnoses:  Fever,  unspecified fever cause  Cellulitis of left lower extremity  Cellulitis of right foot  IV drug abuse (HCC)     MEDICATIONS GIVEN DURING THIS VISIT:  Medications  cefTRIAXone (ROCEPHIN) 1 g in sodium chloride 0.9 % 100 mL IVPB (1 g Intravenous New Bag/Given 09/26/20 0427)  vancomycin (VANCOREADY) IVPB 1750 mg/350 mL (has no administration in time range)  acetaminophen (TYLENOL) tablet 1,000 mg (1,000 mg Oral Given 09/25/20 1808)     ED Discharge Orders     None        Note:  This document was prepared using Dragon voice recognition software and may include unintentional dictation errors.   Loleta RoseForbach, Gabriel Paulding, MD 09/26/20 [phone]

## 2020-09-26 NOTE — H&P (Addendum)
History and Physical    Brendan Perez HDQ:[id number] DOB: May 13, 1990 DOA: 09/26/2020  PCP: Patient, No Pcp Per (Inactive)   Patient coming from: Home  I have personally briefly reviewed patient's old medical records in [facility]  Chief Complaint: Bilateral lower extremity swelling  HPI: Brendan Perez is a 30 y.o. male with medical history significant for IV drug use, nicotine dependence who presents to the ER for 3-day history of swelling and redness in both legs, left greater than right.  Symptoms initially started 4 days prior to his presentation and 24 hours later he developed redness in both legs.  Swelling is associated with pain when he bears weight on his legs and in triage she was noted to have a fever with a temp of 103. He admits to IV drug use, primarily fentanyl and dope and has track marks on his right arm.  He has never injected IV drugs in his feet. He denies having any shortness of breath, no chest pain, no nausea, no vomiting, no dizziness, no lightheadedness, no headache, no abdominal pain, no changes in his bowel habits, no blurred vision, no urinary symptoms. Labs show sodium 131, potassium 4.1, chloride 98, bicarb 28, glucose 127, BUN 10, creatinine 0.71, calcium 8.2, lactic acid 1.3, white count 5.8, hemoglobin 11 point hematocrit 32.7, MCV 86.7, RDW 15.4, platelet count 151, PT 13.6, INR 1.0 Respiratory viral panel is negative Chest x-ray reviewed by me shows no active cardiopulmonary disease X-ray of the left tibia-fibula shows no acute abnormality Bilateral lower extremity venous Dopplers negative for DVT      ED Course: Patient is a 29 year old male with a history of IVDA and nicotine dependence who presents to the ER for evaluation of bilateral lower extremity swelling, redness and fever concerning for cellulitis. He received a dose of IV vancomycin and Rocephin in the ER and will be admitted to the hospital for further evaluation.     Review of  Systems: As per HPI otherwise all other systems reviewed and negative.    Past Medical History:  Diagnosis Date   IVDU (intravenous drug user)     Past Surgical History:  Procedure Laterality Date   EYE SURGERY       reports that he has been smoking cigarettes. He has been smoking an average of 1 pack per day. He has never used smokeless tobacco. He reports previous alcohol use. He reports current drug use. Drugs: IV and Marijuana.  Allergies  Allergen Reactions   Penicillins    Sulfa Antibiotics     History reviewed. No pertinent family history.   Parents are healthy.   Prior to Admission medications   Medication Sig Start Date End Date Taking? Authorizing Provider  methocarbamol (ROBAXIN) 500 MG tablet Take 1 tablet (500 mg total) by mouth 2 (two) times daily. Patient not taking: Reported on 09/26/2020 12/07/18   Henderly, Britni A, PA-C  naproxen (NAPROSYN) 500 MG tablet Take 1 tablet (500 mg total) by mouth 2 (two) times daily. Patient not taking: Reported on 09/26/2020 12/07/18   Linwood Dibbles, PA-C    Physical Exam: Vitals:   09/25/20 1904 09/25/20 2243 09/26/20 0234 09/26/20 0415  BP:  110/75 110/63 116/78  Pulse:  81 85 83  Resp:  16 18 13   Temp:    98.9 F (37.2 C)  TempSrc:    Oral  SpO2:  100% 100% 96%  Weight: 72.6 kg     Height: 5\' 11"  (1.803 m)  Vitals:   09/25/20 1904 09/25/20 2243 09/26/20 0234 09/26/20 0415  BP:  110/75 110/63 116/78  Pulse:  81 85 83  Resp:  16 18 13   Temp:    98.9 F (37.2 C)  TempSrc:    Oral  SpO2:  100% 100% 96%  Weight: 72.6 kg     Height: 5\' 11"  (1.803 m)         Constitutional: Alert and oriented x 3 . Not in any apparent distress HEENT:      Head: Normocephalic and atraumatic.         Eyes: PERLA, EOMI, Conjunctivae are normal. Sclera is non-icteric.       Mouth/Throat: Mucous membranes are moist.       Neck: Supple with no signs of meningismus. Cardiovascular: Regular rate and rhythm. No murmurs,  gallops, or rubs. 2+ symmetrical distal pulses are present . No JVD. No LE edema Respiratory: Respiratory effort normal .Lungs sounds clear bilaterally. No wheezes, crackles, or rhonchi.  Gastrointestinal: Soft, non tender, and non distended with positive bowel sounds.  Genitourinary: No CVA tenderness. Musculoskeletal: Nontender with normal range of motion in all extremities.  Swelling involving both lower extremities with areas of redness extending from the dorsum of the left foot to the leg (Lt > Rt) neurologic:  Face is symmetric. Moving all extremities. No gross focal neurologic deficits . Skin: Skin is warm, dry.  Track marks on the right forearm Psychiatric: Mood and affect are normal    Labs on Admission: I have personally reviewed following labs and imaging studies  CBC: Recent Labs  Lab 09/25/20 1907  WBC 5.8  NEUTROABS 4.4  HGB 11.2*  HCT 32.7*  MCV 86.7  PLT 151   Basic Metabolic Panel: Recent Labs  Lab 09/25/20 1907  NA 131*  K 4.1  CL 98  CO2 28  GLUCOSE 127*  BUN 10  CREATININE 0.71  CALCIUM 8.2*   GFR: Estimated Creatinine Clearance: 139.9 mL/min (by C-G formula based on SCr of 0.71 mg/dL). Liver Function Tests: No results for input(s): AST, ALT, ALKPHOS, BILITOT, PROT, ALBUMIN in the last 168 hours. No results for input(s): LIPASE, AMYLASE in the last 168 hours. No results for input(s): AMMONIA in the last 168 hours. Coagulation Profile: Recent Labs  Lab 09/26/20 0418  INR 1.0   Cardiac Enzymes: No results for input(s): CKTOTAL, CKMB, CKMBINDEX, TROPONINI in the last 168 hours. BNP (last 3 results) No results for input(s): PROBNP in the last 8760 hours. HbA1C: No results for input(s): HGBA1C in the last 72 hours. CBG: No results for input(s): GLUCAP in the last 168 hours. Lipid Profile: No results for input(s): CHOL, HDL, LDLCALC, TRIG, CHOLHDL, LDLDIRECT in the last 72 hours. Thyroid Function Tests: No results for input(s): TSH, T4TOTAL,  FREET4, T3FREE, THYROIDAB in the last 72 hours. Anemia Panel: No results for input(s): VITAMINB12, FOLATE, FERRITIN, TIBC, IRON, RETICCTPCT in the last 72 hours. Urine analysis: No results found for: COLORURINE, APPEARANCEUR, LABSPEC, PHURINE, GLUCOSEU, HGBUR, BILIRUBINUR, KETONESUR, PROTEINUR, UROBILINOGEN, NITRITE, LEUKOCYTESUR  Radiological Exams on Admission: DG Tibia/Fibula Left  Result Date: 09/26/2020 CLINICAL DATA:  Left lower extremity swelling EXAM: LEFT TIBIA AND FIBULA - 2 VIEW COMPARISON:  None. FINDINGS: There is no evidence of fracture or other focal bone lesions. Soft tissues are unremarkable. IMPRESSION: No acute abnormality noted. Electronically Signed   By: 11/26/20 M.D.   On: 09/26/2020 03:17   Alcide Clever Venous Img Lower Bilateral  Result Date: 09/26/2020 CLINICAL DATA:  Initial evaluation  for bilateral lower extremity redness, swelling. EXAM: BILATERAL LOWER EXTREMITY VENOUS DOPPLER ULTRASOUND TECHNIQUE: Gray-scale sonography with graded compression, as well as color Doppler and duplex ultrasound were performed to evaluate the lower extremity deep venous systems from the level of the common femoral vein and including the common femoral, femoral, profunda femoral, popliteal and calf veins including the posterior tibial, peroneal and gastrocnemius veins when visible. The superficial great saphenous vein was also interrogated. Spectral Doppler was utilized to evaluate flow at rest and with distal augmentation maneuvers in the common femoral, femoral and popliteal veins. COMPARISON:  Radiograph from earlier the same day. FINDINGS: RIGHT LOWER EXTREMITY Common Femoral Vein: No evidence of thrombus. Normal compressibility, respiratory phasicity and response to augmentation. Saphenofemoral Junction: No evidence of thrombus. Normal compressibility and flow on color Doppler imaging. Profunda Femoral Vein: No evidence of thrombus. Normal compressibility and flow on color Doppler imaging. Femoral  Vein: No evidence of thrombus. Normal compressibility, respiratory phasicity and response to augmentation. Popliteal Vein: No evidence of thrombus. Normal compressibility, respiratory phasicity and response to augmentation. Calf Veins: No evidence of thrombus. Normal compressibility and flow on color Doppler imaging. Superficial Great Saphenous Vein: No evidence of thrombus. Normal compressibility. Venous Reflux:  None. Other Findings:  None. LEFT LOWER EXTREMITY Common Femoral Vein: No evidence of thrombus. Normal compressibility, respiratory phasicity and response to augmentation. Saphenofemoral Junction: No evidence of thrombus. Normal compressibility and flow on color Doppler imaging. Profunda Femoral Vein: No evidence of thrombus. Normal compressibility and flow on color Doppler imaging. Femoral Vein: No evidence of thrombus. Normal compressibility, respiratory phasicity and response to augmentation. Popliteal Vein: No evidence of thrombus. Normal compressibility, respiratory phasicity and response to augmentation. Calf Veins: No evidence of thrombus. Normal compressibility and flow on color Doppler imaging. Superficial Great Saphenous Vein: No evidence of thrombus. Normal compressibility. Venous Reflux:  None. Other Findings:  None. IMPRESSION: No evidence of deep venous thrombosis in either lower extremity. Electronically Signed   By: Rise Mu M.D.   On: 09/26/2020 04:19   DG Chest Port 1 View  Result Date: 09/26/2020 CLINICAL DATA:  Possible sepsis EXAM: PORTABLE CHEST 1 VIEW COMPARISON:  12/23/2019 FINDINGS: The heart size and mediastinal contours are within normal limits. Both lungs are clear. The visualized skeletal structures are unremarkable. IMPRESSION: No active disease. Electronically Signed   By: Alcide Clever M.D.   On: 09/26/2020 03:16     Assessment/Plan Principal Problem:   Cellulitis Active Problems:   IVDU (intravenous drug user)   Nicotine dependence      Bilateral  lower extremity cellulitis Left leg greater > right leg Patient presented for evaluation of bilateral lower extremity swelling and redness and had a fever with a T-max of 103 He admits to IV drug use and his presentation is concerning for possible bacteremia He received IV Rocephin and vancomycin in the ER but will be placed on weight based Ancef Follow-up results of blood cultures Keep lower extremities elevated     Nicotine dependence Smoking cessation was discussed with patient in detail He declines a nicotine transdermal patch at this time    DVT prophylaxis: Lovenox  Code Status: full code  Family Communication: Greater than 50% of time was spent discussing patient's condition and plan of care with him at the bedside.  All questions and concerns have been addressed.  He verbalizes understanding and agrees with the plan. Disposition Plan: Back to previous home environment Consults called: none  Status: At the time of admission, it appears that  the appropriate admission status for this patient is inpatient. This is judged to be reasonable and necessary in order to provide the required intensity of service to ensure the patient's safety given the presenting symptoms, physical exam findings, and initial radiographic and laboratory data in the context of their comorbid conditions. Patient requires inpatient status due to high intensity of service, high risk for further deterioration and high frequency of surveillance required.    Lucile Shuttersochukwu Talib Headley MD [facility]     09/26/2020, 8:48 AM

## 2020-09-26 NOTE — ED Notes (Signed)
Phlebotomy called to obtain blood cultures.

## 2020-09-26 NOTE — ED Notes (Signed)
US bedside

## 2020-09-26 NOTE — ED Notes (Signed)
MD at the bedside  

## 2020-09-27 LAB — BASIC METABOLIC PANEL
Anion gap: 3 — ABNORMAL LOW (ref 5–15)
BUN: 8 mg/dL (ref 6–20)
CO2: 31 mmol/L (ref 22–32)
Calcium: 8.1 mg/dL — ABNORMAL LOW (ref 8.9–10.3)
Chloride: 102 mmol/L (ref 98–111)
Creatinine, Ser: 0.56 mg/dL — ABNORMAL LOW (ref 0.61–1.24)
GFR, Estimated: >60 mL/min (ref >60)
Glucose, Bld: 88 mg/dL (ref 70–99)
Potassium: 3.9 mmol/L (ref 3.5–5.1)
Sodium: 136 mmol/L (ref 135–145)

## 2020-09-27 LAB — CBC
HCT: 30 % — ABNORMAL LOW (ref 39.0–52.0)
Hemoglobin: 9.9 g/dL — ABNORMAL LOW (ref 13.0–17.0)
MCH: 29.2 pg (ref 26.0–34.0)
MCHC: 33 g/dL (ref 30.0–36.0)
MCV: 88.5 fL (ref 80.0–100.0)
Platelets: 134 10*3/uL — ABNORMAL LOW (ref 150–400)
RBC: 3.39 MIL/uL — ABNORMAL LOW (ref 4.22–5.81)
RDW: 13.7 % (ref 11.5–15.5)
WBC: 3.4 10*3/uL — ABNORMAL LOW (ref 4.0–10.5)
nRBC: 0 % (ref 0.0–0.2)

## 2020-09-27 LAB — HIV ANTIBODY (ROUTINE TESTING W REFLEX): HIV Screen 4th Generation wRfx: NONREACTIVE

## 2020-09-27 MED ORDER — DOXYCYCLINE MONOHYDRATE 100 MG PO TABS: 100.0000 mg | ORAL_TABLET | Freq: Two times a day (BID) | ORAL | 0 refills | Status: AC

## 2020-09-27 MED ORDER — IBUPROFEN 800 MG PO TABS: 800.0000 mg | ORAL_TABLET | Freq: Three times a day (TID) | ORAL | 0 refills | Status: AC | PRN

## 2020-09-27 NOTE — Plan of Care (Signed)

## 2020-09-27 NOTE — Discharge Summary (Signed)
Physician Discharge Summary  Brendan Perez WUJ:[id number] DOB: 01/16/1991 DOA: 09/26/2020  PCP: Patient, No Pcp Per (Inactive)  Admit date: 09/26/2020 Discharge date: 09/27/2020  Admitted From: Home Disposition: Home  Recommendations for Outpatient Follow-up:  Follow up with PCP in 1-2 weeks   Home Health: No Equipment/Devices: None  Discharge Condition: Stable CODE STATUS: Full Diet recommendation: Regular  Brief/Interim Summary: 30 y.o. male with medical history significant for IV drug use, nicotine dependence who presents to the ER for 3-day history of swelling and redness in both legs, left greater than right.  Symptoms initially started 4 days prior to his presentation and 24 hours later he developed redness in both legs.  Swelling is associated with pain when he bears weight on his legs and in triage she was noted to have a fever with a temp of 103. He admits to IV drug use, primarily fentanyl and dope and has track marks on his right arm.  He has never injected IV drugs in his feet.  Patient had rapidly progressive cellulitis of left lower extremity greater than right.  Started on IV antibiotics and intravenous fluids with good result.  Exam is markedly improved the following morning.  Lengthy discussion with the patient.  Advised to cease all intravenous drug use at this time.  He had questions about methadone clinic and establishing for Medicaid which TOC addressed.  Patient will be discharged home with 1 week of p.o. antibiotic therapy.   Discharge Diagnoses:  Principal Problem:   Cellulitis Active Problems:   IVDU (intravenous drug user)   Nicotine dependence  Bilateral lower extremity cellulitis in the setting of IV drug use Left leg greater than right Had one-time fever in ED T-max 103.  No fever subsequently.  Presentation and lower extremity strength improved after 1 day of antibiotics.  Low suspicion for bacteremia.  Received vancomycin and Rocephin in ED.  Will  subsequently transition to weight-based Ancef.  Blood cultures remain negative at time of discharge.  Patient is afebrile and able to move extremities without pain.  Will discharge home with instructions to complete additional 7 days of p.o. antibiotics.  Patient admonished to cease all IV drug use.  Discharge Instructions  Discharge Instructions     Diet - low sodium heart healthy   Complete by: As directed    Increase activity slowly   Complete by: As directed       Allergies as of 09/27/2020       Reactions   Penicillins    Sulfa Antibiotics         Medication List     STOP taking these medications    methocarbamol 500 MG tablet Commonly known as: ROBAXIN   naproxen 500 MG tablet Commonly known as: NAPROSYN       TAKE these medications    doxycycline 100 MG tablet Commonly known as: ADOXA Take 1 tablet (100 mg total) by mouth 2 (two) times daily for 7 days.   ibuprofen 800 MG tablet Commonly known as: ADVIL Take 1 tablet (800 mg total) by mouth every 8 (eight) hours as needed.        Allergies  Allergen Reactions   Penicillins    Sulfa Antibiotics     Consultations: None   Procedures/Studies: DG Tibia/Fibula Left  Result Date: 09/26/2020 CLINICAL DATA:  Left lower extremity swelling EXAM: LEFT TIBIA AND FIBULA - 2 VIEW COMPARISON:  None. FINDINGS: There is no evidence of fracture or other focal bone lesions. Soft tissues are unremarkable.  IMPRESSION: No acute abnormality noted. Electronically Signed   By: Alcide Clever M.D.   On: 09/26/2020 03:17   US Venous Img Lower Bilateral  Result Date: 09/26/2020 CLINICAL DATA:  Initial evaluation for bilateral lower extremity redness, swelling. EXAM: BILATERAL LOWER EXTREMITY VENOUS DOPPLER ULTRASOUND TECHNIQUE: Gray-scale sonography with graded compression, as well as color Doppler and duplex ultrasound were performed to evaluate the lower extremity deep venous systems from the level of the common femoral vein  and including the common femoral, femoral, profunda femoral, popliteal and calf veins including the posterior tibial, peroneal and gastrocnemius veins when visible. The superficial great saphenous vein was also interrogated. Spectral Doppler was utilized to evaluate flow at rest and with distal augmentation maneuvers in the common femoral, femoral and popliteal veins. COMPARISON:  Radiograph from earlier the same day. FINDINGS: RIGHT LOWER EXTREMITY Common Femoral Vein: No evidence of thrombus. Normal compressibility, respiratory phasicity and response to augmentation. Saphenofemoral Junction: No evidence of thrombus. Normal compressibility and flow on color Doppler imaging. Profunda Femoral Vein: No evidence of thrombus. Normal compressibility and flow on color Doppler imaging. Femoral Vein: No evidence of thrombus. Normal compressibility, respiratory phasicity and response to augmentation. Popliteal Vein: No evidence of thrombus. Normal compressibility, respiratory phasicity and response to augmentation. Calf Veins: No evidence of thrombus. Normal compressibility and flow on color Doppler imaging. Superficial Great Saphenous Vein: No evidence of thrombus. Normal compressibility. Venous Reflux:  None. Other Findings:  None. LEFT LOWER EXTREMITY Common Femoral Vein: No evidence of thrombus. Normal compressibility, respiratory phasicity and response to augmentation. Saphenofemoral Junction: No evidence of thrombus. Normal compressibility and flow on color Doppler imaging. Profunda Femoral Vein: No evidence of thrombus. Normal compressibility and flow on color Doppler imaging. Femoral Vein: No evidence of thrombus. Normal compressibility, respiratory phasicity and response to augmentation. Popliteal Vein: No evidence of thrombus. Normal compressibility, respiratory phasicity and response to augmentation. Calf Veins: No evidence of thrombus. Normal compressibility and flow on color Doppler imaging. Superficial Great  Saphenous Vein: No evidence of thrombus. Normal compressibility. Venous Reflux:  None. Other Findings:  None. IMPRESSION: No evidence of deep venous thrombosis in either lower extremity. Electronically Signed   By: Rise Mu M.D.   On: 09/26/2020 04:19   DG Chest Port 1 View  Result Date: 09/26/2020 CLINICAL DATA:  Possible sepsis EXAM: PORTABLE CHEST 1 VIEW COMPARISON:  12/23/2019 FINDINGS: The heart size and mediastinal contours are within normal limits. Both lungs are clear. The visualized skeletal structures are unremarkable. IMPRESSION: No active disease. Electronically Signed   By: Alcide Clever M.D.   On: 09/26/2020 03:16   (Echo, Carotid, EGD, Colonoscopy, ERCP)    Subjective: Patient seen and examined on day of discharge.  Stable no distress.  Strength and lower extremities much improved.  Stable for discharge home.  Discharge Exam: Vitals:   09/27/20 0900 09/27/20 1135  BP: 119/67 115/64  Pulse: 68 63  Resp:  15  Temp:  97.6 F (36.4 C)  SpO2:  100%   Vitals:   09/27/20 0540 09/27/20 0735 09/27/20 0900 09/27/20 1135  BP: 106/66 (!) 102/57 119/67 115/64  Pulse: 62 (!) 58 68 63  Resp: 20 17  15   Temp: 97.9 F (36.6 C) 98.1 F (36.7 C)  97.6 F (36.4 C)  TempSrc: Oral     SpO2: 97% 94%  100%  Weight:      Height:        General: Pt is alert, awake, not in acute distress Cardiovascular: RRR, S1/S2 +,  no rubs, no gallops Respiratory: CTA bilaterally, no wheezing, no rhonchi Abdominal: Soft, NT, ND, bowel sounds + Extremities: Left lower extremity edematous.  Not tender to touch.  Good range of motion.  Normal strength    The results of significant diagnostics from this hospitalization (including imaging, microbiology, ancillary and laboratory) are listed below for reference.     Microbiology: Recent Results (from the past 240 hour(s))  Resp Panel by RT-PCR (Flu A&B, Covid) Nasopharyngeal Swab     Status: None   Collection Time: 09/26/20  3:29 AM    Specimen: Nasopharyngeal Swab; Nasopharyngeal(NP) swabs in vial transport medium  Result Value Ref Range Status   SARS Coronavirus 2 by RT PCR NEGATIVE NEGATIVE Final    Comment: (NOTE) SARS-CoV-2 target nucleic acids are NOT DETECTED.  The SARS-CoV-2 RNA is generally detectable in upper respiratory specimens during the acute phase of infection. The lowest concentration of SARS-CoV-2 viral copies this assay can detect is 138 copies/mL. A negative result does not preclude SARS-Cov-2 infection and should not be used as the sole basis for treatment or other patient management decisions. A negative result may occur with  improper specimen collection/handling, submission of specimen other than nasopharyngeal swab, presence of viral mutation(s) within the areas targeted by this assay, and inadequate number of viral copies(<138 copies/mL). A negative result must be combined with clinical observations, patient history, and epidemiological information. The expected result is Negative.  Fact Sheet for Patients:  BloggerCourse.com  Fact Sheet for Healthcare Providers:  SeriousBroker.it  This test is no t yet approved or cleared by the [place] FDA and  has been authorized for detection and/or diagnosis of SARS-CoV-2 by FDA under an Emergency Use Authorization (EUA). This EUA will remain  in effect (meaning this test can be used) for the duration of the COVID-19 declaration under Section 564(b)(1) of the Act, 21 U.S.C.section 360bbb-3(b)(1), unless the authorization is terminated  or revoked sooner.       Influenza A by PCR NEGATIVE NEGATIVE Final   Influenza B by PCR NEGATIVE NEGATIVE Final    Comment: (NOTE) The Xpert Xpress SARS-CoV-2/FLU/RSV plus assay is intended as an aid in the diagnosis of influenza from Nasopharyngeal swab specimens and should not be used as a sole basis for treatment. Nasal washings and aspirates are  unacceptable for Xpert Xpress SARS-CoV-2/FLU/RSV testing.  Fact Sheet for Patients: BloggerCourse.com  Fact Sheet for Healthcare Providers: SeriousBroker.it  This test is not yet approved or cleared by the [place] FDA and has been authorized for detection and/or diagnosis of SARS-CoV-2 by FDA under an Emergency Use Authorization (EUA). This EUA will remain in effect (meaning this test can be used) for the duration of the COVID-19 declaration under Section 564(b)(1) of the Act, 21 U.S.C. section 360bbb-3(b)(1), unless the authorization is terminated or revoked.  Performed at [facility], [address] [place], [place] [address]   Blood Culture (routine x 2)     Status: None (Preliminary result)   Collection Time: 09/26/20  4:20 AM   Specimen: BLOOD  Result Value Ref Range Status   Specimen Description BLOOD RIGHT FA  Final   Special Requests   Final    BOTTLES DRAWN AEROBIC AND ANAEROBIC Blood Culture results may not be optimal due to an inadequate volume of blood received in culture bottles   Culture   Final    NO GROWTH 1 DAY Performed at [facility], [address] [place], [place] [address]    Report Status  PENDING  Incomplete  Blood Culture (routine x 2)     Status: None (Preliminary result)   Collection Time: 09/26/20  4:24 AM   Specimen: BLOOD  Result Value Ref Range Status   Specimen Description BLOOD LEFT FA  Final   Special Requests   Final    BOTTLES DRAWN AEROBIC AND ANAEROBIC Blood Culture results may not be optimal due to an inadequate volume of blood received in culture bottles   Culture   Final    NO GROWTH 1 DAY Performed at [facility], [address] [place], [place] [id number]    Report Status PENDING  Incomplete     Labs: BNP (last 3 results) No results for input(s): BNP in the last 8760 hours. Basic Metabolic Panel: Recent Labs  Lab 09/25/20 1907  09/27/20 0504  NA 131* 136  K 4.1 3.9  CL 98 102  CO2 28 31  GLUCOSE 127* 88  BUN 10 8  CREATININE 0.71 0.56*  CALCIUM 8.2* 8.1*   Liver Function Tests: No results for input(s): AST, ALT, ALKPHOS, BILITOT, PROT, ALBUMIN in the last 168 hours. No results for input(s): LIPASE, AMYLASE in the last 168 hours. No results for input(s): AMMONIA in the last 168 hours. CBC: Recent Labs  Lab 09/25/20 1907 09/27/20 0504  WBC 5.8 3.4*  NEUTROABS 4.4  --   HGB 11.2* 9.9*  HCT 32.7* 30.0*  MCV 86.7 88.5  PLT 151 134*   Cardiac Enzymes: No results for input(s): CKTOTAL, CKMB, CKMBINDEX, TROPONINI in the last 168 hours. BNP: Invalid input(s): POCBNP CBG: No results for input(s): GLUCAP in the last 168 hours. D-Dimer No results for input(s): DDIMER in the last 72 hours. Hgb A1c No results for input(s): HGBA1C in the last 72 hours. Lipid Profile No results for input(s): CHOL, HDL, LDLCALC, TRIG, CHOLHDL, LDLDIRECT in the last 72 hours. Thyroid function studies No results for input(s): TSH, T4TOTAL, T3FREE, THYROIDAB in the last 72 hours.  Invalid input(s): FREET3 Anemia work up No results for input(s): VITAMINB12, FOLATE, FERRITIN, TIBC, IRON, RETICCTPCT in the last 72 hours. Urinalysis No results found for: COLORURINE, APPEARANCEUR, LABSPEC, PHURINE, GLUCOSEU, HGBUR, BILIRUBINUR, KETONESUR, PROTEINUR, UROBILINOGEN, NITRITE, LEUKOCYTESUR Sepsis Labs Invalid input(s): PROCALCITONIN,  WBC,  LACTICIDVEN Microbiology Recent Results (from the past 240 hour(s))  Resp Panel by RT-PCR (Flu A&B, Covid) Nasopharyngeal Swab     Status: None   Collection Time: 09/26/20  3:29 AM   Specimen: Nasopharyngeal Swab; Nasopharyngeal(NP) swabs in vial transport medium  Result Value Ref Range Status   SARS Coronavirus 2 by RT PCR NEGATIVE NEGATIVE Final    Comment: (NOTE) SARS-CoV-2 target nucleic acids are NOT DETECTED.  The SARS-CoV-2 RNA is generally detectable in upper respiratory specimens  during the acute phase of infection. The lowest concentration of SARS-CoV-2 viral copies this assay can detect is 138 copies/mL. A negative result does not preclude SARS-Cov-2 infection and should not be used as the sole basis for treatment or other patient management decisions. A negative result may occur with  improper specimen collection/handling, submission of specimen other than nasopharyngeal swab, presence of viral mutation(s) within the areas targeted by this assay, and inadequate number of viral copies(<138 copies/mL). A negative result must be combined with clinical observations, patient history, and epidemiological information. The expected result is Negative.  Fact Sheet for Patients:  BloggerCourse.comhttps://www.fda.gov/media/152166/download  Fact Sheet for Healthcare Providers:  SeriousBroker.ithttps://www.fda.gov/media/152162/download  This test is no t yet approved or cleared by the [place] FDA and  has  been authorized for detection and/or diagnosis of SARS-CoV-2 by FDA under an Emergency Use Authorization (EUA). This EUA will remain  in effect (meaning this test can be used) for the duration of the COVID-19 declaration under Section 564(b)(1) of the Act, 21 U.S.C.section 360bbb-3(b)(1), unless the authorization is terminated  or revoked sooner.       Influenza A by PCR NEGATIVE NEGATIVE Final   Influenza B by PCR NEGATIVE NEGATIVE Final    Comment: (NOTE) The Xpert Xpress SARS-CoV-2/FLU/RSV plus assay is intended as an aid in the diagnosis of influenza from Nasopharyngeal swab specimens and should not be used as a sole basis for treatment. Nasal washings and aspirates are unacceptable for Xpert Xpress SARS-CoV-2/FLU/RSV testing.  Fact Sheet for Patients: BloggerCourse.com  Fact Sheet for Healthcare Providers: SeriousBroker.it  This test is not yet approved or cleared by the [place] FDA and has been authorized for detection  and/or diagnosis of SARS-CoV-2 by FDA under an Emergency Use Authorization (EUA). This EUA will remain in effect (meaning this test can be used) for the duration of the COVID-19 declaration under Section 564(b)(1) of the Act, 21 U.S.C. section 360bbb-3(b)(1), unless the authorization is terminated or revoked.  Performed at [facility], [address] [place], [place] [address]   Blood Culture (routine x 2)     Status: None (Preliminary result)   Collection Time: 09/26/20  4:20 AM   Specimen: BLOOD  Result Value Ref Range Status   Specimen Description BLOOD RIGHT FA  Final   Special Requests   Final    BOTTLES DRAWN AEROBIC AND ANAEROBIC Blood Culture results may not be optimal due to an inadequate volume of blood received in culture bottles   Culture   Final    NO GROWTH 1 DAY Performed at [facility], [address] [place], [place] [address]    Report Status PENDING  Incomplete  Blood Culture (routine x 2)     Status: None (Preliminary result)   Collection Time: 09/26/20  4:24 AM   Specimen: BLOOD  Result Value Ref Range Status   Specimen Description BLOOD LEFT FA  Final   Special Requests   Final    BOTTLES DRAWN AEROBIC AND ANAEROBIC Blood Culture results may not be optimal due to an inadequate volume of blood received in culture bottles   Culture   Final    NO GROWTH 1 DAY Performed at [facility], [address] [place], [place] [address]    Report Status PENDING  Incomplete     Time coordinating discharge: Over 30 minutes  SIGNED:   Tresa Moore, MD  [facility] 09/27/2020, 2:11 PM Pager   If 7PM-7AM, please contact night-coverage

## 2020-09-27 NOTE — TOC Initial Note (Signed)
Transition of Care [facility]) - Initial/Assessment Note    Patient Details  Name: Brendan Perez MRN: [id number] Date of Birth: 12/17/1990  Transition of Care [facility]) CM/SW Contact:    Su Hilt, RN Phone Number: 09/27/2020, 10:50 AM  Clinical Narrative:               Met with the patient and discussed dc plan and needs He has housing, he has transportation, he stated that he is working on and off, He was interested in getting Methadone, I explained there are methadone clinics and he can take part in that, I provided him with [facility] telephone number as well l as a list of Drug abuse resources to be able to get off drugs, He stated that he is trying to buy his own Medical insurance but also wants dental and vision, I explained that he can apply for Medicaid but with his age and no Chronic disease he may only qualify for family planning medicaid, I encouraged him to go to or to call the DSS, he stated that he does not want to do that, I told him about [facility] and Juanda Crumble drew and he said he was not interested.           Patient Goals and CMS Choice        Expected Discharge Plan and Services           Expected Discharge Date: 09/27/20                                    Prior Living Arrangements/Services                       Activities of Daily Living Home Assistive Devices/Equipment: Eyeglasses ADL Screening (condition at time of admission) Patient's cognitive ability adequate to safely complete daily activities?: Yes Is the patient deaf or have difficulty hearing?: No Does the patient have difficulty seeing, even when wearing glasses/contacts?: No Does the patient have difficulty concentrating, remembering, or making decisions?: No Patient able to express need for assistance with ADLs?: Yes Does the patient have difficulty dressing or bathing?: No Independently performs ADLs?: Yes (appropriate for developmental age) Does the patient have  difficulty walking or climbing stairs?: Yes Weakness of Legs: Both Weakness of Arms/Hands: None  Permission Sought/Granted                  Emotional Assessment              Admission diagnosis:  Cellulitis [L03.90] Cellulitis of right foot [L03.115] Cellulitis of left lower extremity [L03.116] IV drug abuse ([place]) [F19.10] Fever, unspecified fever cause [R50.9] Patient Active Problem List   Diagnosis Date Noted   Cellulitis 09/26/2020   IVDU (intravenous drug user) 09/26/2020   Nicotine dependence 09/26/2020   PCP:  Patient, No Pcp Per (Inactive) Pharmacy:  No Pharmacies Listed    Social Determinants of Health (SDOH) Interventions    Readmission Risk Interventions No flowsheet data found.

## 2020-09-27 NOTE — Plan of Care (Signed)
Patient discharged home per MD orders at this time.All discharge instructions,education and medications reviewed with patient at bedside.patient expressed understanding and will comply with discharge instructions.follow up appointments also communicated to patient.no verbal c/o or any ssx of distress.patient was transported home by family in a private car.

## 2020-10-01 LAB — CULTURE, BLOOD (ROUTINE X 2)
Culture: NO GROWTH
Culture: NO GROWTH

## 2022-03-27 ENCOUNTER — Encounter: Payer: Self-pay | Admitting: Emergency Medicine

## 2022-03-27 ENCOUNTER — Emergency Department
Admission: EM | Admit: 2022-03-27 | Discharge: 2022-03-27 | Disposition: A | Discharge status: Home / Self Care | Payer: Medicaid Other | Attending: Emergency Medicine | Admitting: Emergency Medicine

## 2022-03-27 ENCOUNTER — Emergency Department: Payer: Medicaid Other

## 2022-03-27 DIAGNOSIS — W11XXXA Fall on and from ladder, initial encounter: Secondary | ICD-10-CM | POA: Insufficient documentation

## 2022-03-27 DIAGNOSIS — S20229A Contusion of unspecified back wall of thorax, initial encounter: Secondary | ICD-10-CM | POA: Insufficient documentation

## 2022-03-27 DIAGNOSIS — M549 Dorsalgia, unspecified: Secondary | ICD-10-CM | POA: Diagnosis present

## 2022-03-27 DIAGNOSIS — W19XXXA Unspecified fall, initial encounter: Secondary | ICD-10-CM

## 2022-03-27 MED ORDER — PREDNISONE 20 MG PO TABS
60.0000 mg | ORAL_TABLET | Freq: Once | ORAL | Status: AC
  Administered 2022-03-27: 60 mg via ORAL
  Filled 2022-03-27: qty 3

## 2022-03-27 MED ORDER — KETOROLAC TROMETHAMINE 30 MG/ML IJ SOLN
30.0000 mg | Freq: Once | INTRAMUSCULAR | Status: AC
  Administered 2022-03-27: 30 mg via INTRAMUSCULAR
  Filled 2022-03-27: qty 1

## 2022-03-27 MED ORDER — METHOCARBAMOL 500 MG PO TABS: 500.0000 mg | ORAL_TABLET | Freq: Four times a day (QID) | ORAL | 0 refills | Status: AC

## 2022-03-27 MED ORDER — KETOROLAC TROMETHAMINE 10 MG PO TABS: 10.0000 mg | ORAL_TABLET | Freq: Four times a day (QID) | ORAL | 0 refills | Status: AC | PRN

## 2022-03-27 MED ORDER — METHOCARBAMOL 500 MG PO TABS
1000.0000 mg | ORAL_TABLET | Freq: Once | ORAL | Status: AC
  Administered 2022-03-27: 1000 mg via ORAL
  Filled 2022-03-27: qty 2

## 2022-03-27 MED ORDER — PREDNISONE 50 MG PO TABS: 50.0000 mg | ORAL_TABLET | Freq: Every day | ORAL | 0 refills | Status: AC

## 2022-03-27 NOTE — ED Provider Notes (Signed)
[facility] Provider Note  Patient Contact: 4:11 PM (approximate)   History   Back Pain   HPI  Brendan Perez is a 32 y.o. male  who presents to the ED complaining of mid and lower back pain pain after a flal. Patient was on the top rung of a ladder that is 6 ft high. Patient fell backwards and landed on his back.        Physical Exam   Triage Vital Signs: ED Triage Vitals [03/27/22 1513]  Enc Vitals Group     BP (!) 140/89     Pulse Rate 94     Resp 18     Temp 97.8 F (36.6 C)     Temp Source Oral     SpO2 96 %     Weight      Height      Head Circumference      Peak Flow      Pain Score 5     Pain Loc      Pain Edu?      Excl. in [place]?     Most recent vital signs: Vitals:   03/27/22 1513  BP: (!) 140/89  Pulse: 94  Resp: 18  Temp: 97.8 F (36.6 C)  SpO2: 96%     General: Alert and in no acute distress. Head: No acute traumatic findings  Neck: No stridor. No cervical spine tenderness to palpation.  Cardiovascular:  Good peripheral perfusion Respiratory: Normal respiratory effort without tachypnea or retractions. Lungs CTAB. Good air entry to the bases with no decreased or absent breath sounds Musculoskeletal: Full range of motion to all extremities.  No acute visible signs of trauma with open wounds, regions, lacerations or ecchymosis.  Patient is diffusely tender in the bottom of the thoracic extending into the superior portion of the lumbar spine midline as well as mildly on bilateral paraspinal muscle groups.  No appreciable palpable abnormality or step-off.  Full range of motion to lower extremities with pulses and sensation intact and equal bilateral lower extremities. Neurologic:  No gross focal neurologic deficits are appreciated.  Skin:   No rash noted Other:   ED Results / Procedures / Treatments   Labs (all labs ordered are listed, but only abnormal results are displayed) Labs Reviewed - No data to  display   EKG     RADIOLOGY  I personally viewed, evaluated, and interpreted these images as part of my medical decision making, as well as reviewing the written report by the radiologist.  ED Provider Interpretation: No acute traumatic findings to the thoracic or lumbar spine on CT.  Specifically no fracture or evidence of herniated disc  CT Thoracic Spine Wo Contrast  Result Date: 03/27/2022 CLINICAL DATA:  [place] 6 feet from a ladder.  Back pain. EXAM: CT THORACIC AND LUMBAR SPINE WITHOUT CONTRAST TECHNIQUE: Multidetector CT imaging of the thoracic and lumbar spine was performed without contrast. Multiplanar CT image reconstructions were also generated. RADIATION DOSE REDUCTION: This exam was performed according to the departmental dose-optimization program which includes automated exposure control, adjustment of the mA and/or kV according to patient size and/or use of iterative reconstruction technique. COMPARISON:  None Available. FINDINGS: CT THORACIC SPINE FINDINGS Alignment: Normal Vertebrae: Normal.  No traumatic finding. Paraspinal and other soft tissues: Normal Disc levels: Normal CT LUMBAR SPINE FINDINGS Segmentation: 5 lumbar type vertebral bodies. Alignment: Normal Vertebrae: Normal.  No fracture. Paraspinal and other soft tissues: Normal Disc levels: Normal.  No  traumatic or degenerative finding. IMPRESSION: 1. CT thoracic spine impression. Normal. No traumatic finding. 2. CT lumbar spine impression. Normal. No traumatic finding. Electronically Signed   By: Nelson Chimes M.D.   On: 03/27/2022 17:22   CT Lumbar Spine Wo Contrast  Result Date: 03/27/2022 CLINICAL DATA:  [place] 6 feet from a ladder.  Back pain. EXAM: CT THORACIC AND LUMBAR SPINE WITHOUT CONTRAST TECHNIQUE: Multidetector CT imaging of the thoracic and lumbar spine was performed without contrast. Multiplanar CT image reconstructions were also generated. RADIATION DOSE REDUCTION: This exam was performed according to the  departmental dose-optimization program which includes automated exposure control, adjustment of the mA and/or kV according to patient size and/or use of iterative reconstruction technique. COMPARISON:  None Available. FINDINGS: CT THORACIC SPINE FINDINGS Alignment: Normal Vertebrae: Normal.  No traumatic finding. Paraspinal and other soft tissues: Normal Disc levels: Normal CT LUMBAR SPINE FINDINGS Segmentation: 5 lumbar type vertebral bodies. Alignment: Normal Vertebrae: Normal.  No fracture. Paraspinal and other soft tissues: Normal Disc levels: Normal.  No traumatic or degenerative finding. IMPRESSION: 1. CT thoracic spine impression. Normal. No traumatic finding. 2. CT lumbar spine impression. Normal. No traumatic finding. Electronically Signed   By: Nelson Chimes M.D.   On: 03/27/2022 17:22    PROCEDURES:  Critical Care performed: No  Procedures   MEDICATIONS ORDERED IN ED: Medications  ketorolac (TORADOL) 30 MG/ML injection 30 mg (has no administration in time range)  predniSONE (DELTASONE) tablet 60 mg (has no administration in time range)  methocarbamol (ROBAXIN) tablet 1,000 mg (has no administration in time range)     IMPRESSION / MDM / ASSESSMENT AND PLAN / ED COURSE  I reviewed the triage vital signs and the nursing notes.                                 Differential diagnosis includes, but is not limited to, fall, compression fracture, vertebral body fracture, herniated disc, bulging disc, contusion, muscle spasm  Patient's presentation is most consistent with acute presentation with potential threat to life or bodily function.   Patient's diagnosis is consistent with fall, contusion of the back, thoracic and lumbar back pain.  Patient presents emergency department after falling off the top of a 6 foot ladder.  Patient landed on his back.  Patient was having mild symptoms initially that have been progressively worsening.  There is no neurodeficits or radicular symptoms  currently.  Patient denies any bowel or bladder dysfunction, saddle anesthesia or paresthesias.  He was tender midline so imaging was ordered.  Given the mechanism of injury from fall on a ladder CT scan was preferred for imaging purposes.  No evidence of fracture or disc injury at this time.  He did not hit his head, lose consciousness or suffer any other injury during this fall.  At this time patient will be treated symptomatically with Toradol, prednisone and Robaxin.  Initial Toradol shot given here in the emergency department.  Follow-up and concerning signs and symptoms discussed with the patient.  Follow-up primary care as needed.. Patient is given ED precautions to return to the ED for any worsening or new symptoms.     FINAL CLINICAL IMPRESSION(S) / ED DIAGNOSES   Final diagnoses:  Fall, initial encounter  Contusion of back, unspecified laterality, initial encounter     Rx / DC Orders   ED Discharge Orders     None  Note:  This document was prepared using Dragon voice recognition software and may include unintentional dictation errors.   Brynda Peon 03/27/22 Tami Ribas, MD 03/27/22 2017

## 2022-03-27 NOTE — ED Triage Notes (Signed)
Patient to ED for lower back pain. Patient states he fell off a 45ft ladder at work on Friday and pain has slowly increased since. Ambulatory to triage. NAD noted.

## 2022-05-07 ENCOUNTER — Emergency Department
Admission: EM | Admit: 2022-05-07 | Discharge: 2022-05-07 | Disposition: A | Discharge status: Home / Self Care | Payer: Medicaid Other | Attending: Emergency Medicine | Admitting: Emergency Medicine

## 2022-05-07 ENCOUNTER — Other Ambulatory Visit: Payer: Self-pay

## 2022-05-07 ENCOUNTER — Encounter: Payer: Self-pay | Admitting: Emergency Medicine

## 2022-05-07 ENCOUNTER — Emergency Department: Payer: Medicaid Other

## 2022-05-07 DIAGNOSIS — S6991XA Unspecified injury of right wrist, hand and finger(s), initial encounter: Secondary | ICD-10-CM | POA: Diagnosis present

## 2022-05-07 DIAGNOSIS — S63501A Unspecified sprain of right wrist, initial encounter: Secondary | ICD-10-CM | POA: Insufficient documentation

## 2022-05-07 DIAGNOSIS — X503XXA Overexertion from repetitive movements, initial encounter: Secondary | ICD-10-CM | POA: Insufficient documentation

## 2022-05-07 MED ORDER — NAPROXEN 500 MG PO TABS
500.0000 mg | ORAL_TABLET | Freq: Once | ORAL | Status: AC
  Administered 2022-05-07: 500 mg via ORAL
  Filled 2022-05-07: qty 1

## 2022-05-07 MED ORDER — CEPHALEXIN 500 MG PO CAPS: 500.0000 mg | ORAL_CAPSULE | Freq: Four times a day (QID) | ORAL | 0 refills | Status: AC

## 2022-05-07 NOTE — ED Triage Notes (Signed)
Pt to ED via POV for right wrist pain and swelling. Pt denies recent injury. Pt states that he did have surgery on the area in the past. Pt is in NAD.

## 2022-05-07 NOTE — Discharge Instructions (Addendum)
Please schedule follow-up visit with orthopedics.  Return to the ER if your swelling or pain becomes worse.  Return right away if you develop a fever, severe redness, or develop numbness weakness or cold or purple-colored hand.

## 2022-05-07 NOTE — ED Provider Notes (Signed)
[facility] Provider Note   Event Date/Time   First MD Initiated Contact with Patient 05/07/22 1542     (approximate)  History   Wrist Pain  HPI  Brendan Perez is a 32 y.o. male forts no major ongoing medical history  He started developing pain in his right wrist yesterday, is for the most part not very painful but when he goes to use it like when he got to go get on his motorcycle today he notices pretty significant pain in his right wrist especially when he goes to do action such as hold the bike up to disengage the kickstand, utilize the throttle etc.  He reports he seen swelling over the backside, he points toward the dorsal surface of his right distal forearm and wrist.  It hurts to bend his wrist back.  He has minimal discomfort with gripping or closing the hand but hurts more when he goes to move the hand back.  The skin seems a little bit red.  No fevers.  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 --      Excl. in [place]? --     Most recent vital signs: Vitals:   05/07/22 1528 05/07/22 1529  BP: (!) 145/88   Pulse:    Resp:    Temp:    SpO2:  100%     General: Awake, no distress.  CV:  Good peripheral perfusion.  Normal tones and rate Resp:  Normal effort.  Clear lungs bilaterally Abd:  No distention.  Soft  nontender non-distended Other:    RIGHT Right upper extremity demonstrates normal strength, good use of all muscles except for fairly limitation in flexion extension of the right wrist where there is very mild edema overlying the dorsal aspect of the wrist and slight forearm.  There is very minimal erythema.  No well-demarcated edges it does not feel particularly hot to touch and there is no drainage or fluid collection noted. No edema bruising or contusions of the right shoulder/upper arm, right elbow, and the hand appears normal with normal digits and no evidence of flexor tendon pain or discomfort.  Full range of motion of the right right upper extremity without pain sipped at the wrist particularly with extension. No evidence of trauma. Strong radial pulse. Intact median/ulnar/radial neuro-muscular exam.      ED Results / Procedures / Treatments   Labs (all labs ordered are listed, but only abnormal results are displayed) Labs Reviewed - No data to display   EKG  RADIOLOGY  Right wrist x-ray interpreted by me as negative for acute gross injury or fracture.  Radiologist does do note some chronic calcifications   PROCEDURES:  Critical Care performed: No  Procedures   MEDICATIONS ORDERED IN ED: Medications  naproxen (NAPROSYN) tablet 500 mg (500 mg Oral Given 05/07/22 1641)     IMPRESSION / MDM / ASSESSMENT AND PLAN / ED COURSE  I reviewed the triage vital signs and the nursing notes.                              Differential diagnosis includes, but is not limited to, likely tendinitis, suspect possible overuse type injury or strain, however also extensor tendon tear would be possible though he demonstrates good flexion extension of all digits and does not have any obvious limitations.  He also additionally has some very mild erythema in the area but no fever or obvious infectious symptoms.  Out of caution, will place patient on cephalexin in the event this is an early  cellulitis.  Discussed with patient we will have him follow-up with orthopedics.  Provided splint for use as needed and recommended that he try to rest the extremity at least for a week or 2 prior to return to full use, but not sure how easily this will be done with his occupation.  Patient's presentation is most consistent with acute complicated illness / injury requiring diagnostic workup.   Neurovascular intact clinical exam findings most consistent with likely tendinitis involving the extensor tendons of the right wrist.  Discussed with patient, careful return precautions, placed into splint for comfort, he will minimize use of the wrist and allow it to rest for at least the next 1 to 2 weeks.  Recommendation to follow-up with orthopedics.  Antibiotics discussed and careful return precautions.  At this juncture I find it clinically unlikely he has cellulitis but given the associated erythema and tenderness could represent early cellulitic changes well thus antibiotic prescribed       FINAL CLINICAL IMPRESSION(S) / ED DIAGNOSES   Final diagnoses:  Sprain of right wrist, initial encounter     Rx / DC Orders   ED Discharge Orders          Ordered    cephALEXin (KEFLEX) 500 MG capsule  4 times daily        05/07/22 1601             Note:  This document was prepared using Dragon voice recognition software and may include unintentional dictation errors.   Delman Kitten, MD 05/07/22 [phone]

## 2023-01-05 IMAGING — US US EXTREM LOW VENOUS
1 series · 13 of 24 positions shown · non-contrast
Comparison: Radiograph from earlier the same day.

CLINICAL DATA: Initial evaluation for bilateral lower extremity
redness, swelling.



[Series 1: us venous img lower bilat (dvt) · portal-venous · 13 of 58 slices shown]
[im 1/58]
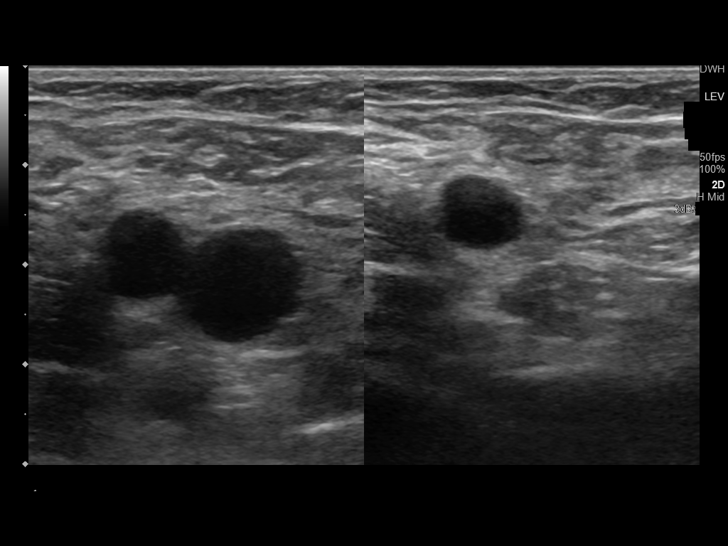
[im 5/58]
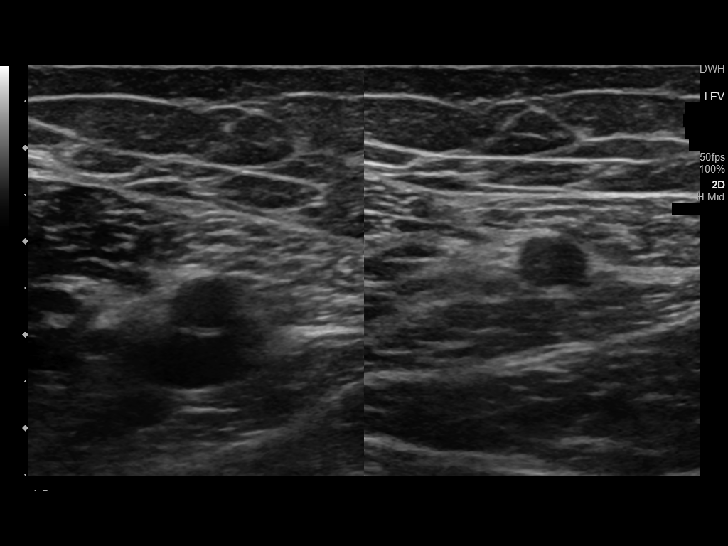
[im 10/58]
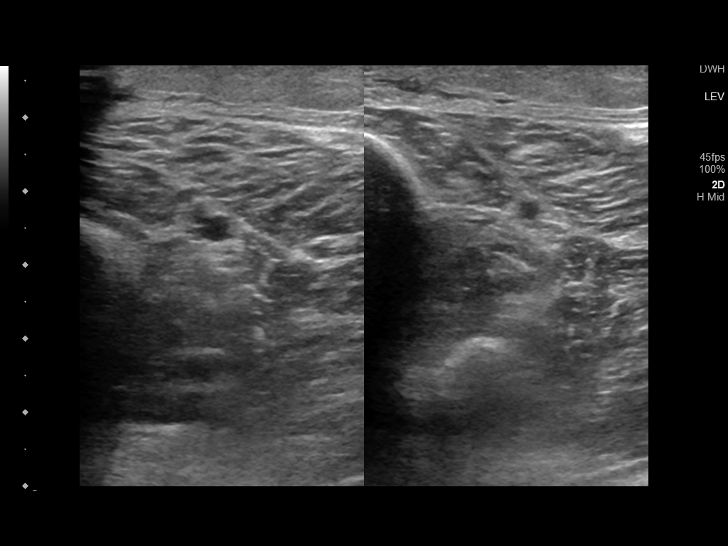
[im 15/58]
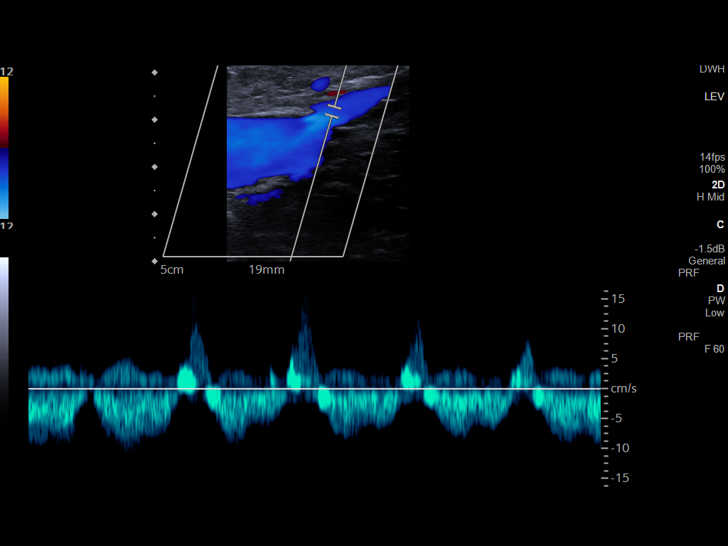
[im 20/58]
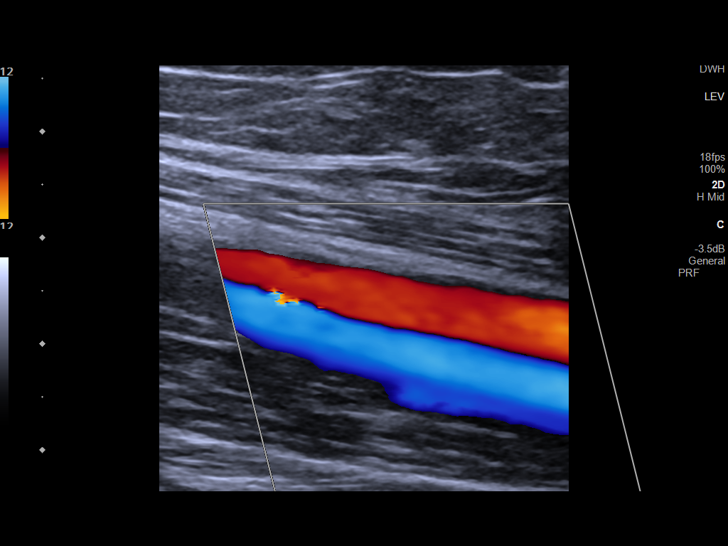
[im 25/58]
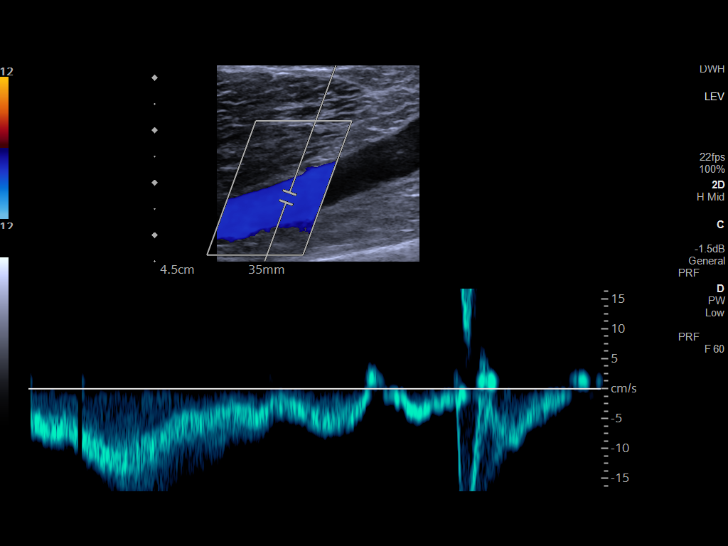
[im 30/58]
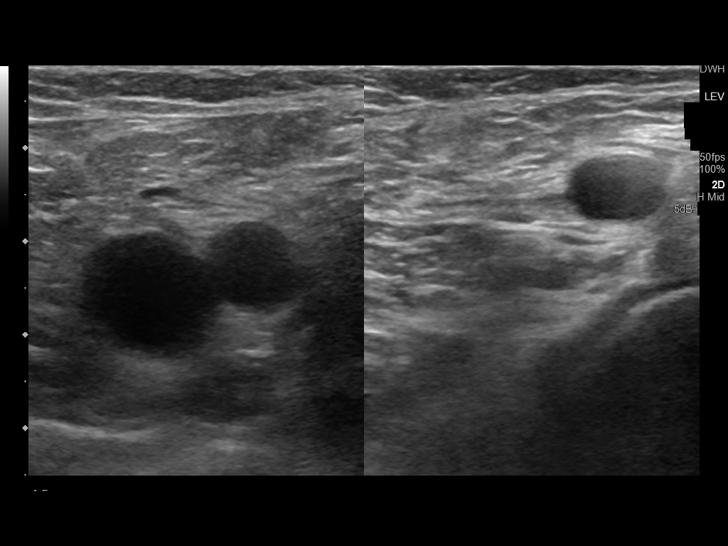
[im 33/58]
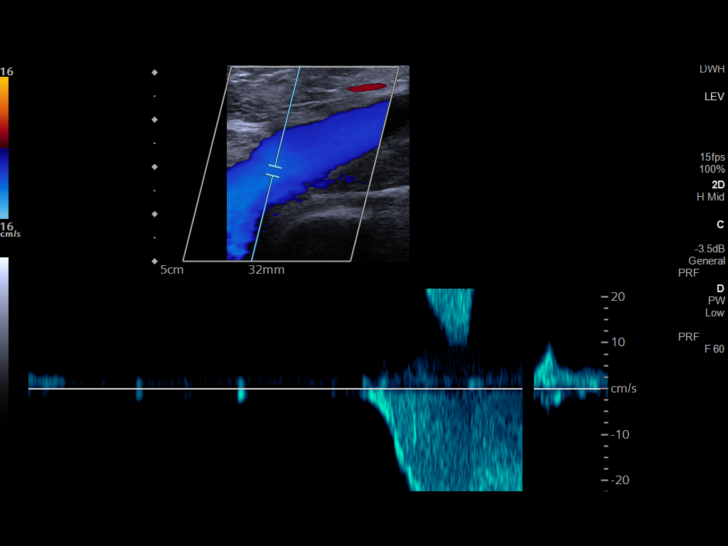
[im 38/58]
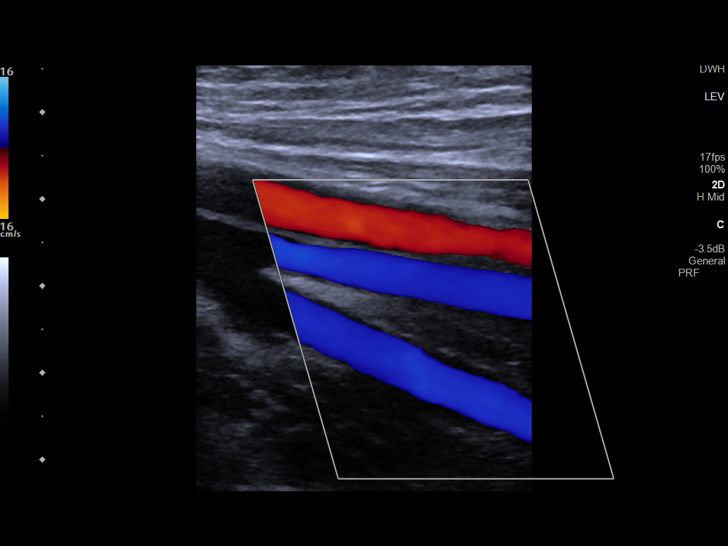
[im 43/58]
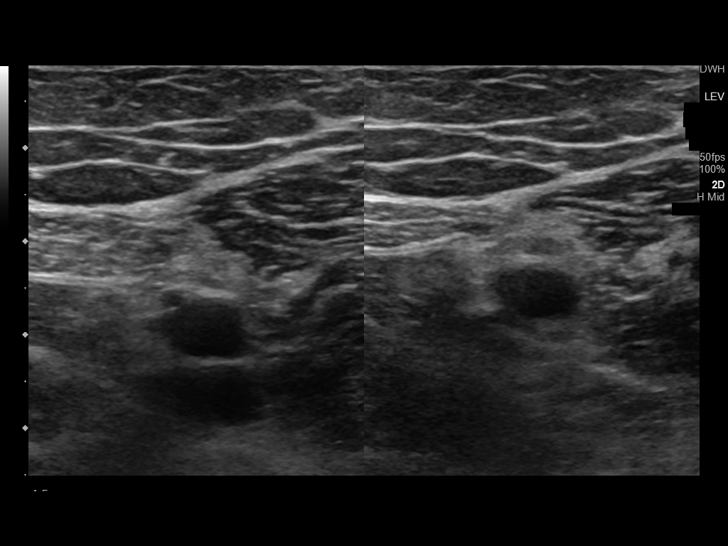
[im 48/58]
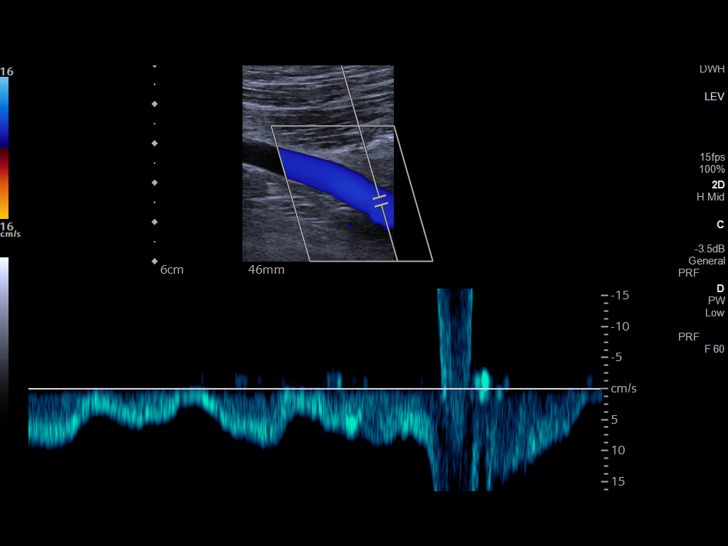
[im 53/58]
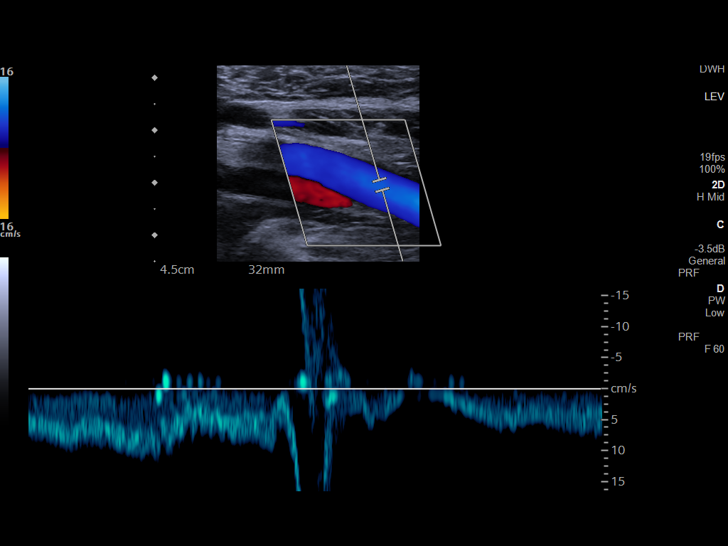
[im 58/58]
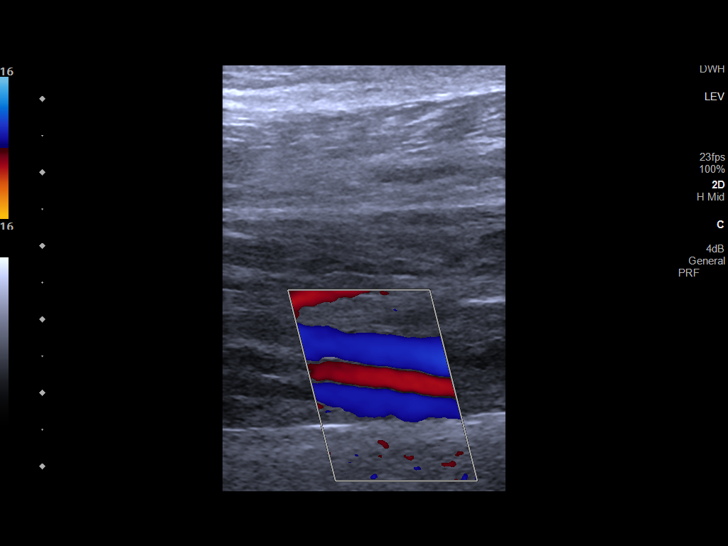

[13 of 24 positions shown; findings below may reference images not displayed]

FINDINGS: RIGHT LOWER EXTREMITY

Common Femoral Vein: No evidence of thrombus. Normal
compressibility, respiratory phasicity and response to augmentation.

Saphenofemoral Junction: No evidence of thrombus. Normal
compressibility and flow on color Doppler imaging.

Profunda Femoral Vein: No evidence of thrombus. Normal
compressibility and flow on color Doppler imaging.

Femoral Vein: No evidence of thrombus. Normal compressibility,
respiratory phasicity and response to augmentation.

Popliteal Vein: No evidence of thrombus. Normal compressibility,
respiratory phasicity and response to augmentation.

Calf Veins: No evidence of thrombus. Normal compressibility and flow
on color Doppler imaging.

Superficial Great Saphenous Vein: No evidence of thrombus. Normal
compressibility.

Venous Reflux:  None.

Other Findings:  None.

LEFT LOWER EXTREMITY

Common Femoral Vein: No evidence of thrombus. Normal
compressibility, respiratory phasicity and response to augmentation.

Saphenofemoral Junction: No evidence of thrombus. Normal
compressibility and flow on color Doppler imaging.

Profunda Femoral Vein: No evidence of thrombus. Normal
compressibility and flow on color Doppler imaging.

Femoral Vein: No evidence of thrombus. Normal compressibility,
respiratory phasicity and response to augmentation.

Popliteal Vein: No evidence of thrombus. Normal compressibility,
respiratory phasicity and response to augmentation.

Calf Veins: No evidence of thrombus. Normal compressibility and flow
on color Doppler imaging.

Superficial Great Saphenous Vein: No evidence of thrombus. Normal
compressibility.

Venous Reflux:  None.

Other Findings:  None.
IMPRESSION: No evidence of deep venous thrombosis in either lower extremity.

## 2023-01-05 IMAGING — DX DG CHEST 1V PORT
1 series · 1 of 1 positions shown · non-contrast
Comparison: 12/23/2019

CLINICAL DATA: Possible sepsis

EXAM:
PORTABLE CHEST 1 VIEW

[chest ap]
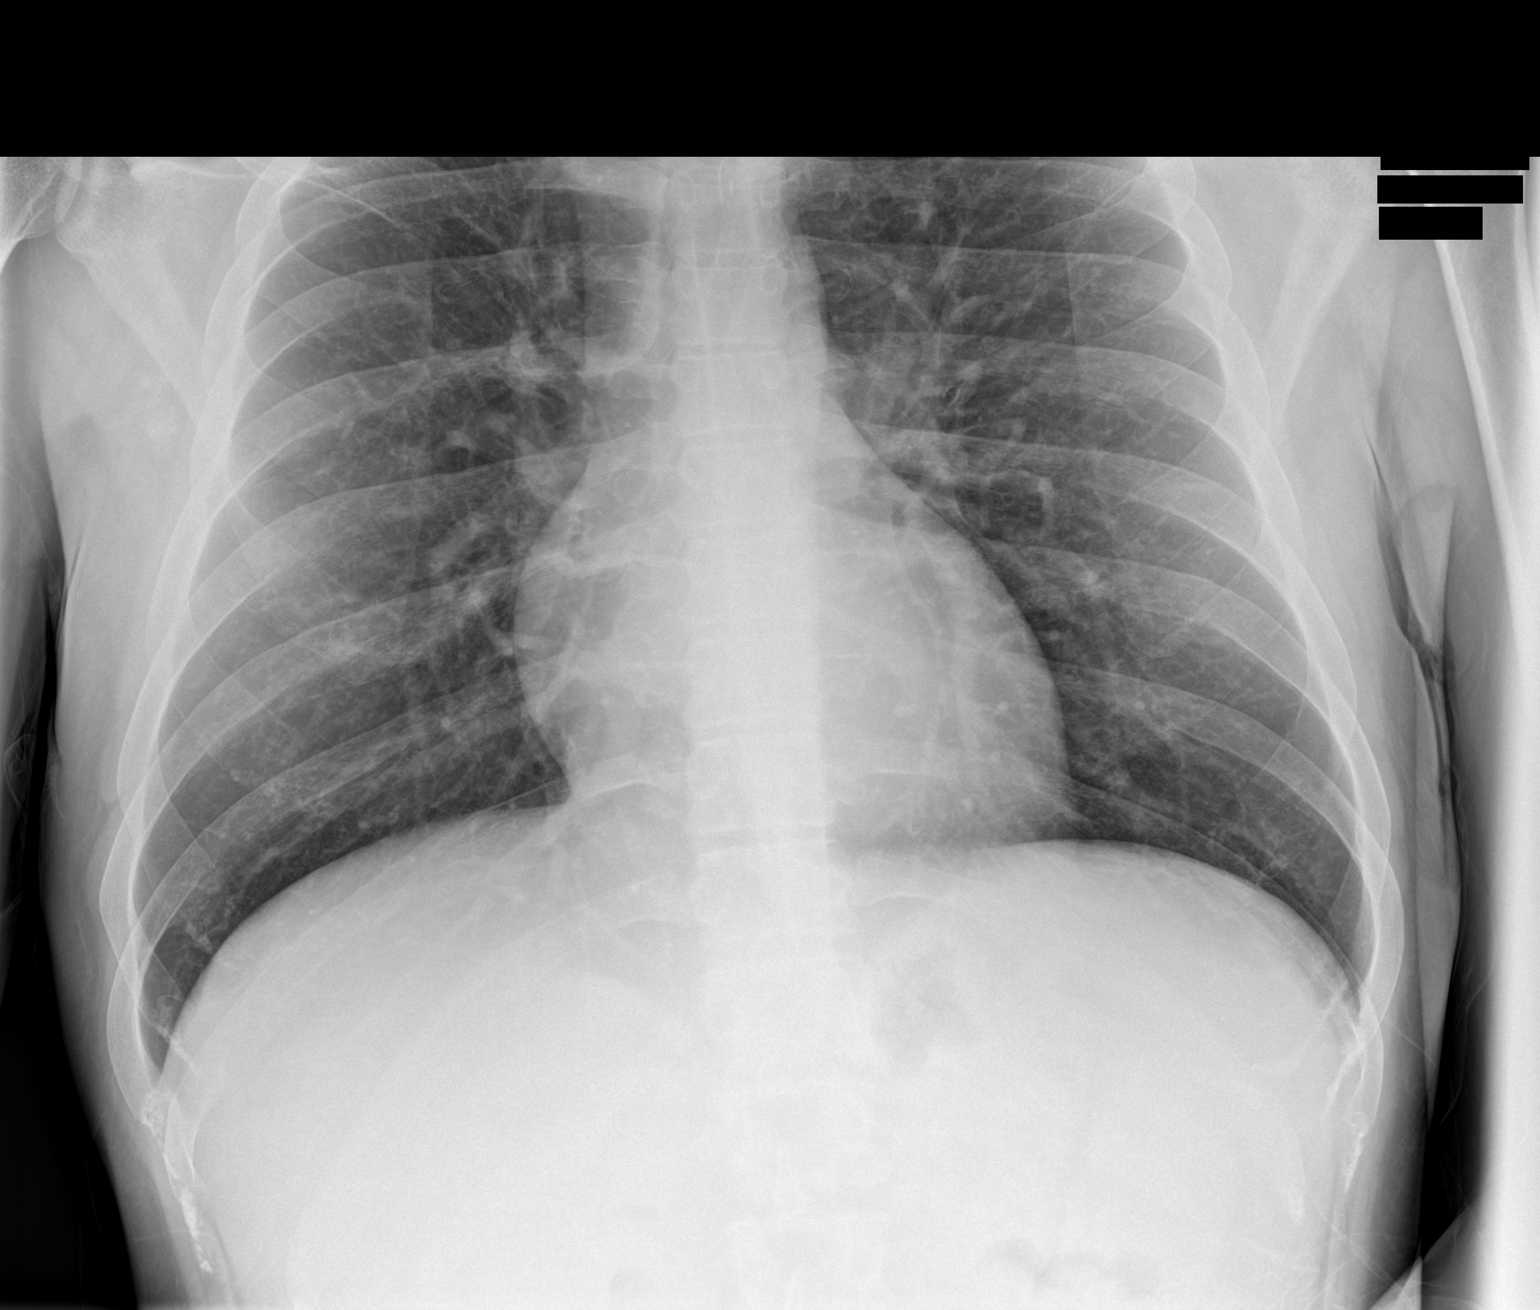

[1 of 1 positions shown; findings below may reference images not displayed]

FINDINGS: The heart size and mediastinal contours are within normal limits.
Both lungs are clear. The visualized skeletal structures are
unremarkable.
IMPRESSION: No active disease.

## 2023-01-05 IMAGING — DX DG TIBIA/FIBULA 2V*L*
2 series · 2 of 2 positions shown · non-contrast
Comparison: None.

CLINICAL DATA: Left lower extremity swelling

EXAM:
LEFT TIBIA AND FIBULA - 2 VIEW

[tibia ap]
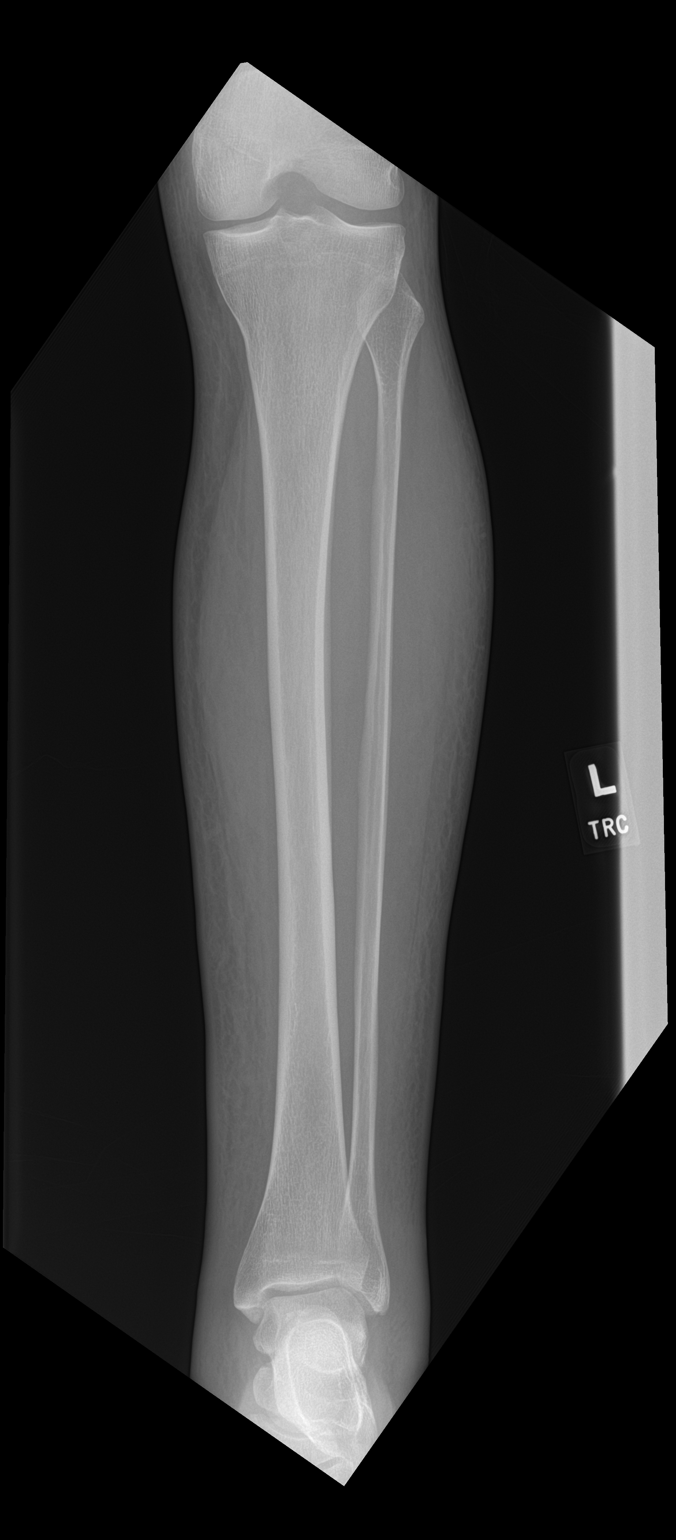

[tibia lat]
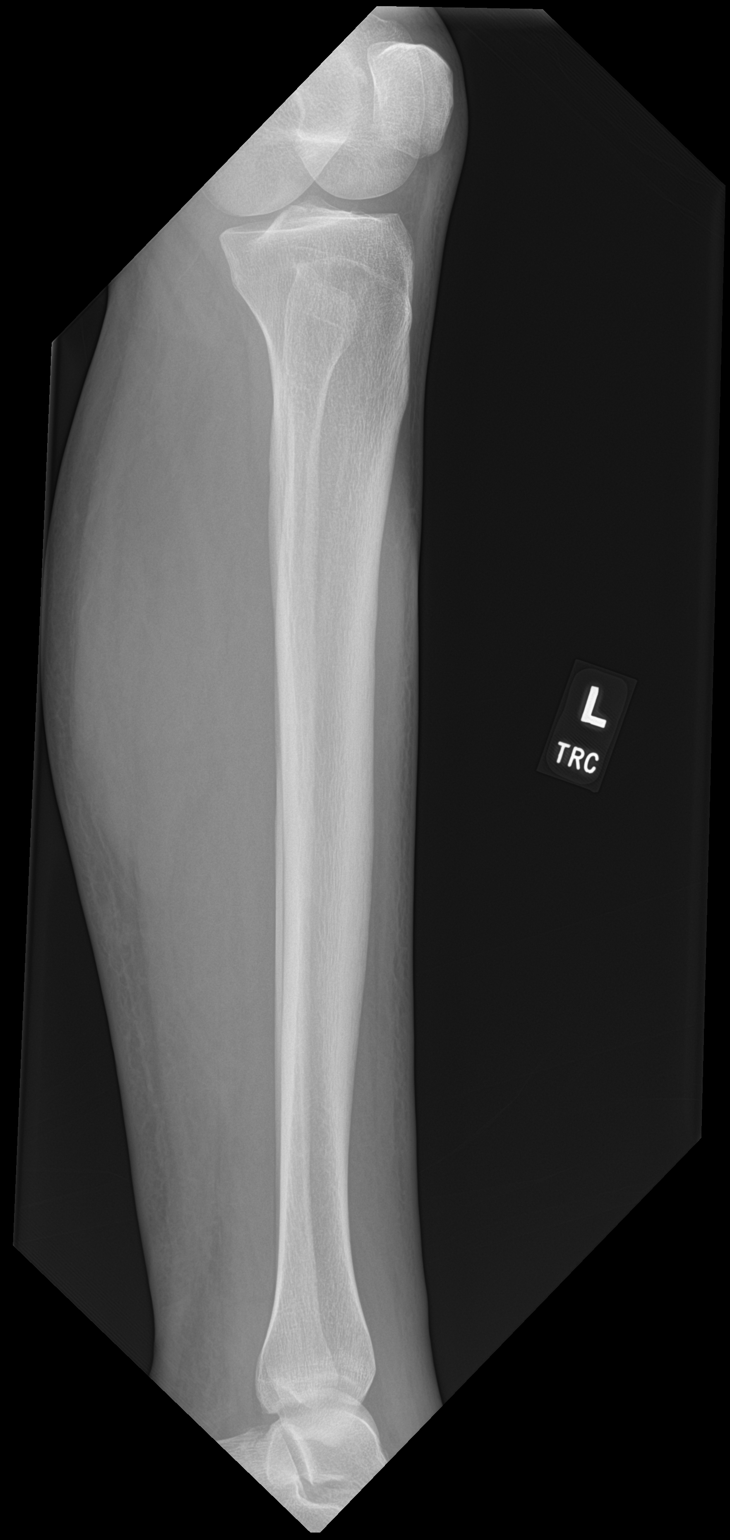

[2 of 2 positions shown; findings below may reference images not displayed]

FINDINGS: There is no evidence of fracture or other focal bone lesions. Soft
tissues are unremarkable.
IMPRESSION: No acute abnormality noted.
